# Patient Record
Sex: Male | Born: 1960 | Race: White | Hispanic: No | Marital: Single | Smoking: Current every day smoker
Health system: Southern US, Community
[De-identification: ages and names within clinical notes are randomized; demographics above are authoritative.]

## PROBLEM LIST (undated history)

## (undated) DIAGNOSIS — M1990 Unspecified osteoarthritis, unspecified site: Secondary | ICD-10-CM

## (undated) DIAGNOSIS — K859 Acute pancreatitis without necrosis or infection, unspecified: Secondary | ICD-10-CM

## (undated) DIAGNOSIS — K000 Anodontia: Secondary | ICD-10-CM

## (undated) DIAGNOSIS — K08109 Complete loss of teeth, unspecified cause, unspecified class: Secondary | ICD-10-CM

## (undated) HISTORY — PX: APPENDECTOMY: SHX54

---

## 2016-07-21 ENCOUNTER — Encounter: Payer: Self-pay | Admitting: Emergency Medicine

## 2016-07-21 ENCOUNTER — Emergency Department: Payer: Self-pay

## 2016-07-21 ENCOUNTER — Inpatient Hospital Stay
Admission: EM | Admit: 2016-07-21 | Discharge: 2016-07-24 | DRG: 439 | Disposition: A | Discharge status: Home / Self Care | Payer: Self-pay | Attending: Internal Medicine | Admitting: Internal Medicine

## 2016-07-21 DIAGNOSIS — K5900 Constipation, unspecified: Secondary | ICD-10-CM | POA: Diagnosis present

## 2016-07-21 DIAGNOSIS — K2980 Duodenitis without bleeding: Secondary | ICD-10-CM | POA: Diagnosis present

## 2016-07-21 DIAGNOSIS — K630 Abscess of intestine: Secondary | ICD-10-CM | POA: Diagnosis present

## 2016-07-21 DIAGNOSIS — K859 Acute pancreatitis without necrosis or infection, unspecified: Secondary | ICD-10-CM

## 2016-07-21 DIAGNOSIS — R748 Abnormal levels of other serum enzymes: Secondary | ICD-10-CM | POA: Diagnosis present

## 2016-07-21 DIAGNOSIS — F17210 Nicotine dependence, cigarettes, uncomplicated: Secondary | ICD-10-CM | POA: Diagnosis present

## 2016-07-21 DIAGNOSIS — E876 Hypokalemia: Secondary | ICD-10-CM | POA: Diagnosis present

## 2016-07-21 DIAGNOSIS — D6959 Other secondary thrombocytopenia: Secondary | ICD-10-CM | POA: Diagnosis present

## 2016-07-21 DIAGNOSIS — Z9049 Acquired absence of other specified parts of digestive tract: Secondary | ICD-10-CM

## 2016-07-21 DIAGNOSIS — F10188 Alcohol abuse with other alcohol-induced disorder: Secondary | ICD-10-CM | POA: Diagnosis present

## 2016-07-21 DIAGNOSIS — K852 Alcohol induced acute pancreatitis without necrosis or infection: Principal | ICD-10-CM | POA: Diagnosis present

## 2016-07-21 DIAGNOSIS — R1084 Generalized abdominal pain: Secondary | ICD-10-CM

## 2016-07-21 HISTORY — DX: Acute pancreatitis without necrosis or infection, unspecified: K85.90

## 2016-07-21 LAB — COMPREHENSIVE METABOLIC PANEL
ALBUMIN: 3.9 g/dL (ref 3.5–5.0)
ALK PHOS: 95 U/L (ref 38–126)
ALT: 38 U/L (ref 17–63)
AST: 49 U/L — AB (ref 15–41)
Anion gap: 13 (ref 5–15)
BUN: 11 mg/dL (ref 6–20)
CO2: 29 mmol/L (ref 22–32)
CREATININE: 0.52 mg/dL — AB (ref 0.61–1.24)
Calcium: 9.1 mg/dL (ref 8.9–10.3)
Chloride: 94 mmol/L — ABNORMAL LOW (ref 101–111)
GFR calc Af Amer: >60 mL/min (ref >60)
GLUCOSE: 142 mg/dL — AB (ref 65–99)
Potassium: 3.4 mmol/L — ABNORMAL LOW (ref 3.5–5.1)
Sodium: 136 mmol/L (ref 135–145)
TOTAL PROTEIN: 7.6 g/dL (ref 6.5–8.1)
Total Bilirubin: 1.8 mg/dL — ABNORMAL HIGH (ref 0.3–1.2)

## 2016-07-21 LAB — CBC WITH DIFFERENTIAL/PLATELET
BASOS ABS: 0 10*3/uL (ref 0–0.1)
Basophils Relative: 0 %
Eosinophils Absolute: 0 10*3/uL (ref 0–0.7)
Eosinophils Relative: 0 %
HEMATOCRIT: 47.4 % (ref 40.0–52.0)
HEMOGLOBIN: 16.7 g/dL (ref 13.0–18.0)
LYMPHS PCT: 9 %
Lymphs Abs: 0.5 10*3/uL — ABNORMAL LOW (ref 1.0–3.6)
MCH: 36.9 pg — ABNORMAL HIGH (ref 26.0–34.0)
MCHC: 35.2 g/dL (ref 32.0–36.0)
MCV: 104.7 fL — AB (ref 80.0–100.0)
MONO ABS: 0.5 10*3/uL (ref 0.2–1.0)
Monocytes Relative: 8 %
NEUTROS ABS: 4.9 10*3/uL (ref 1.4–6.5)
NEUTROS PCT: 83 %
Platelets: 131 10*3/uL — ABNORMAL LOW (ref 150–440)
RBC: 4.52 MIL/uL (ref 4.40–5.90)
RDW: 12.9 % (ref 11.5–14.5)
WBC: 5.9 10*3/uL (ref 3.8–10.6)

## 2016-07-21 LAB — PHOSPHORUS: Phosphorus: 3.3 mg/dL (ref 2.5–4.6)

## 2016-07-21 LAB — MAGNESIUM: MAGNESIUM: 1.9 mg/dL (ref 1.7–2.4)

## 2016-07-21 LAB — LIPASE, BLOOD: LIPASE: 1127 U/L — AB (ref 11–51)

## 2016-07-21 MED ORDER — ACETAMINOPHEN 650 MG RE SUPP: 650.0000 mg | Freq: Four times a day (QID) | RECTAL | Status: DC | PRN

## 2016-07-21 MED ORDER — HYDROMORPHONE HCL 1 MG/ML IJ SOLN
1.0000 mg | Freq: Once | INTRAMUSCULAR | Status: AC
  Administered 2016-07-21: 1 mg via INTRAVENOUS
  Filled 2016-07-21: qty 1

## 2016-07-21 MED ORDER — SENNOSIDES-DOCUSATE SODIUM 8.6-50 MG PO TABS: 1.0000 | ORAL_TABLET | Freq: Every evening | ORAL | Status: DC | PRN

## 2016-07-21 MED ORDER — ENOXAPARIN SODIUM 40 MG/0.4ML ~~LOC~~ SOLN
40.0000 mg | SUBCUTANEOUS | Status: DC
  Administered 2016-07-21 – 2016-07-23 (×3): 40 mg via SUBCUTANEOUS
  Filled 2016-07-21 (×3): qty 0.4

## 2016-07-21 MED ORDER — SODIUM CHLORIDE 0.9 % IV SOLN
INTRAVENOUS | Status: DC
  Administered 2016-07-22 – 2016-07-23 (×3): via INTRAVENOUS

## 2016-07-21 MED ORDER — SODIUM CHLORIDE 0.9 % IV SOLN
1000.0000 mL | Freq: Once | INTRAVENOUS | Status: AC
  Administered 2016-07-21: 1000 mL via INTRAVENOUS

## 2016-07-21 MED ORDER — ACETAMINOPHEN 325 MG PO TABS: 650.0000 mg | ORAL_TABLET | Freq: Four times a day (QID) | ORAL | Status: DC | PRN

## 2016-07-21 MED ORDER — ONDANSETRON HCL 4 MG PO TABS: 4.0000 mg | ORAL_TABLET | Freq: Four times a day (QID) | ORAL | Status: DC | PRN

## 2016-07-21 MED ORDER — MAGNESIUM CITRATE PO SOLN
1.0000 | Freq: Once | ORAL | Status: DC | PRN
  Filled 2016-07-21: qty 296

## 2016-07-21 MED ORDER — SODIUM CHLORIDE 0.9% FLUSH
3.0000 mL | Freq: Two times a day (BID) | INTRAVENOUS | Status: DC
  Administered 2016-07-23: 3 mL via INTRAVENOUS

## 2016-07-21 MED ORDER — FAMOTIDINE IN NACL 20-0.9 MG/50ML-% IV SOLN
20.0000 mg | Freq: Once | INTRAVENOUS | Status: AC
  Administered 2016-07-21: 20 mg via INTRAVENOUS
  Filled 2016-07-21: qty 50

## 2016-07-21 MED ORDER — BISACODYL 5 MG PO TBEC: 5.0000 mg | DELAYED_RELEASE_TABLET | Freq: Every day | ORAL | Status: DC | PRN

## 2016-07-21 MED ORDER — THIAMINE HCL 100 MG/ML IJ SOLN
Freq: Once | INTRAVENOUS | Status: AC
  Administered 2016-07-21: 22:00:00 via INTRAVENOUS
  Filled 2016-07-21: qty 1000

## 2016-07-21 MED ORDER — POTASSIUM CHLORIDE 10 MEQ/100ML IV SOLN
10.0000 meq | INTRAVENOUS | Status: AC
  Administered 2016-07-21 – 2016-07-22 (×4): 10 meq via INTRAVENOUS
  Filled 2016-07-21 (×4): qty 100

## 2016-07-21 MED ORDER — LORAZEPAM 2 MG/ML IJ SOLN: 1.0000 mg | Freq: Four times a day (QID) | INTRAMUSCULAR | Status: DC | PRN

## 2016-07-21 MED ORDER — HYDROMORPHONE HCL 1 MG/ML IJ SOLN: 1.0000 mg | INTRAMUSCULAR | Status: DC | PRN

## 2016-07-21 MED ORDER — LORAZEPAM 2 MG/ML IJ SOLN
1.0000 mg | Freq: Once | INTRAMUSCULAR | Status: AC
  Administered 2016-07-21: 1 mg via INTRAVENOUS
  Filled 2016-07-21: qty 1

## 2016-07-21 MED ORDER — LORAZEPAM 1 MG PO TABS: 1.0000 mg | ORAL_TABLET | Freq: Four times a day (QID) | ORAL | Status: DC | PRN

## 2016-07-21 MED ORDER — ONDANSETRON HCL 4 MG/2ML IJ SOLN: 4.0000 mg | Freq: Four times a day (QID) | INTRAMUSCULAR | Status: DC | PRN

## 2016-07-21 MED ORDER — MORPHINE SULFATE (PF) 2 MG/ML IV SOLN: 1.0000 mg | INTRAVENOUS | Status: DC | PRN

## 2016-07-21 MED ORDER — IOPAMIDOL (ISOVUE-300) INJECTION 61%
100.0000 mL | Freq: Once | INTRAVENOUS | Status: AC | PRN
  Administered 2016-07-21: 100 mL via INTRAVENOUS

## 2016-07-21 MED ORDER — ONDANSETRON HCL 4 MG/2ML IJ SOLN
4.0000 mg | Freq: Once | INTRAMUSCULAR | Status: AC
  Administered 2016-07-21: 4 mg via INTRAVENOUS
  Filled 2016-07-21: qty 2

## 2016-07-21 MED ORDER — FAMOTIDINE IN NACL 20-0.9 MG/50ML-% IV SOLN
20.0000 mg | Freq: Two times a day (BID) | INTRAVENOUS | Status: DC
  Administered 2016-07-21 – 2016-07-23 (×4): 20 mg via INTRAVENOUS
  Filled 2016-07-21 (×7): qty 50

## 2016-07-21 NOTE — ED Notes (Signed)
Pt back from ct

## 2016-07-21 NOTE — ED Provider Notes (Signed)
[facility] Emergency Department Provider Note        Time seen: ----------------------------------------- 3:46 PM on 07/21/2016 -----------------------------------------    I have reviewed the triage vital signs and the nursing notes.   HISTORY  Chief Complaint Abdominal Pain    HPI Jordan Horton is a 55 y.o. male who presents to ER for diffuse abdominal pain that started around midnight last night. Patient states he's not had a bowel movement today which is unusual for him. He complains of abdominal tenderness diffusely in his abdomen, has multiple episodes of vomiting today. He denies any fevers or chills, states he smokes daily and drinks 6-12 beers daily. Patient has not had alcohol today due to his abdominal pain. Previous history of appendectomy. Nothing makes symptoms better.   History reviewed. No pertinent past medical history.  There are no active problems to display for this patient.   Past Surgical History:  Procedure Laterality Date  . APPENDECTOMY      Allergies Review of patient's allergies indicates no known allergies.  Social History Social History  Substance Use Topics  . Smoking status: Current Every Day Smoker    Packs/day: 1.00    Types: Cigarettes  . Smokeless tobacco: Never Used  . Alcohol use Yes     Comment: 6-12 beers daily last drank 07/20/16    Review of Systems Constitutional: Negative for fever. Cardiovascular: Negative for chest pain. Respiratory: Negative for shortness of breath. Gastrointestinal: Positive for abdominal pain, vomiting Genitourinary: Negative for dysuria. Musculoskeletal: Negative for back pain. Skin: Negative for rash. Neurological: Negative for headaches, focal weakness or numbness.  10-point ROS otherwise negative.  ____________________________________________   PHYSICAL EXAM:  VITAL SIGNS: ED Triage Vitals  Enc Vitals Group     BP 07/21/16 1530 (!) 163/103     Pulse  Rate 07/21/16 1530 94     Resp 07/21/16 1530 20     Temp 07/21/16 1530 98 F (36.7 C)     Temp Source 07/21/16 1530 Oral     SpO2 07/21/16 1530 96 %     Weight 07/21/16 1531 140 lb (63.5 kg)     Height 07/21/16 1531 5\' 5"  (1.651 m)     Head Circumference --      Peak Flow --      Pain Score 07/21/16 1531 8     Pain Loc --      Pain Edu? --      Excl. in [place]? --    Constitutional: Alert and oriented. Mild distress Eyes: Conjunctivae are normal. PERRL. Normal extraocular movements. ENT   Head: Normocephalic and atraumatic.   Nose: No congestion/rhinnorhea.   Mouth/Throat: Mucous membranes are moist.   Neck: No stridor. Cardiovascular: Normal rate, regular rhythm. No murmurs, rubs, or gallops. Respiratory: Normal respiratory effort without tachypnea nor retractions. Breath sounds are clear and equal bilaterally. No wheezes/rales/rhonchi. Gastrointestinal: Distended, diffuse tenderness, rebound and guarding are present. Musculoskeletal: Nontender with normal range of motion in all extremities. No lower extremity tenderness nor edema. Neurologic:  Normal speech and language. No gross focal neurologic deficits are appreciated. Mild tremors noted Skin:  Skin is warm, dry and intact. No rash noted. Psychiatric: Mood and affect are normal. Speech and behavior are normal.  ____________________________________________  EKG: Interpreted by me. Sinus rhythm with rate of 69 bpm, normal PR interval, normal QRS, long QT, normal axis. Nonspecific ST-T wave changes.  ____________________________________________  ED COURSE:  Pertinent labs & imaging results that were available during my care of  the patient were reviewed by me and considered in my medical decision making (see chart for details). Clinical Course  Patient presents with a diffuse abdominal pain. He presents with signs of peritonitis of unclear etiology. We will assess with CT imaging, give IV pain medicine as well as IV  Ativan for alcohol withdrawal.  Procedures ____________________________________________   LABS (pertinent positives/negatives)  Labs Reviewed  CBC WITH DIFFERENTIAL/PLATELET - Abnormal; Notable for the following:       Result Value   MCV 104.7 (*)    MCH 36.9 (*)    Platelets 131 (*)    Lymphs Abs 0.5 (*)    All other components within normal limits  COMPREHENSIVE METABOLIC PANEL - Abnormal; Notable for the following:    Potassium 3.4 (*)    Chloride 94 (*)    Glucose, Bld 142 (*)    Creatinine, Ser 0.52 (*)    AST 49 (*)    Total Bilirubin 1.8 (*)    All other components within normal limits  LIPASE, BLOOD - Abnormal; Notable for the following:    Lipase 1,127 (*)    All other components within normal limits  URINALYSIS COMPLETEWITH MICROSCOPIC ([facility] ONLY)  URINALYSIS COMPLETEWITH MICROSCOPIC ([facility] ONLY)    RADIOLOGY Images were viewed by me  CT of the abdomen and pelvis with contrast IMPRESSION: 1. The second and third portions of the duodenum are markedly abnormal with wall thickening and mucosal enhancement. There is suspicion for intramural abscesses in the wall of the duodenum. I also suspect ulcerations but no evidence of perforation. There is inflammation of the adjacent pancreas as well. The duodenum is more abnormal in appearance than the pancreas and I suspect the underlying etiology is a severe duodenitis with secondary affects on the pancreas. Pancreatitis affecting the duodenum is considered less likely. A small fluid collection in the uncinate process may connect with the duodenal wall and could represent a small abscess or pseudocyst. 2. Atherosclerosis in the aorta. 3. Probable developing small-bowel ileus. Findings called to Dr. Jimmye Norman  ____________________________________________  FINAL ASSESSMENT AND PLAN  Abdominal pain, alcohol withdrawal, Pancreatitis, possible duodenal mass versus inflammation  Plan: Patient with labs and imaging  as dictated above. Unclear etiology of some of the source of his symptoms. CT finding is not clear at this time. He may require MR imaging of the abdomen. We do not have GI coverage tonight. We will have coverage tomorrow. I have discussed with surgery, it is felt he does not need NG tube treatment at this time. We will place him on withdrawal precautions for alcohol and pain medicine as needed. I will discuss with the hospitalist for admission.   Earleen [place], MD   Note: This dictation was prepared with Dragon dictation. Any transcriptional errors that result from this process are unintentional    Earleen [place], MD 07/21/16 1756

## 2016-07-21 NOTE — H&P (Addendum)
[place] @ [facility] Admission History and Physical Harvie Bridge, D.O.  ---------------------------------------------------------------------------------------------------------------------   PATIENT NAME: Jordan Horton MR#: [id number] DATE OF BIRTH: 08-29-1961 DATE OF ADMISSION: 07/21/2016 PRIMARY CARE PHYSICIAN: No PCP Per Patient  REQUESTING/REFERRING PHYSICIAN: ED Dr. Jimmye Norman  CHIEF COMPLAINT: Chief Complaint  Patient presents with  . Abdominal Pain    HISTORY OF PRESENT ILLNESS: Jordan Horton is a 55 y.o. male with a known history of EtoH abuse  presents to the emergency department complaining of The or abdominal pain which is localized to the mid epigastrium associated with multiple episodes of nonbloody nonbilious vomiting and constipation for the past one day. Patient states that he has not had any symptoms like this in the past. He has not taken any medication or sought any medical attention for these symptoms.  Otherwise there has been no change in status. Patient has been taking medication as prescribed and there has been no recent change in medication or diet.  There has been no recent illness, travel or sick contacts.    Patient denies fevers/chills, weakness, dizziness, chest pain, shortness of breath, , dysuria/frequency, changes in mental status.   EMS/ED COURSE:   Patient receivedPepcid, Dilaudid, Ativan and Zofran  PAST MEDICAL HISTORY: Alcohol use disorder. Otherwise patient denies     PAST SURGICAL HISTORY: Past Surgical History:  Procedure Laterality Date  . APPENDECTOMY        SOCIAL HISTORY: Social History  Substance Use Topics  . Smoking status: Current Every Day Smoker    Packs/day: 1.00    Types: Cigarettes  . Smokeless tobacco: Never Used  . Alcohol use Yes     Comment: 6-12 beers daily last drank 07/20/16      FAMILY HISTORY: Patient denies    MEDICATIONS AT HOME: Prior to Admission medications   Not on  File      DRUG ALLERGIES: No Known Allergies   REVIEW OF SYSTEMS: CONSTITUTIONAL: No fatigue, weakness, fever, chills, weight gain/loss, headache EYES: No blurry or double vision. ENT: No tinnitus, postnasal drip, redness or soreness of the oropharynx. RESPIRATORY: No dyspnea, cough, wheeze, hemoptysis. CARDIOVASCULAR: No chest pain, orthopnea, palpitations, syncope. GASTROINTESTINAL:  positive  nausea and vomiting, constipation,abdominal pain. No hematemesis, melena or hematochezia. GENITOURINARY: No dysuria, frequency, hematuria. ENDOCRINE: No polyuria or nocturia. No heat or cold intolerance. HEMATOLOGY: No anemia, bruising, bleeding. INTEGUMENTARY: No rashes, ulcers, lesions. MUSCULOSKELETAL: No pain, arthritis, swelling, gout. NEUROLOGIC: No numbness, tingling, weakness or ataxia. No seizure-type activity. PSYCHIATRIC: No anxiety, depression, insomnia.  PHYSICAL EXAMINATION: VITAL SIGNS: Blood pressure (!) 167/108, pulse 94, temperature 98 F (36.7 C), temperature source Oral, resp. rate 20, height 5\' 5"  (1.651 m), weight 63.5 kg (140 lb), SpO2 93 %.  GENERAL: 55 y.o.-year-oldwhite male  patient,disheveled and somewhat lethargic secondary to medication. lying in the bed in no acute distress.  Pleasant and cooperative.   HEENT: Head atraumatic, normocephalic. Pupils equal, round, reactive to light and accommodation. No scleral icterus. Extraocular muscles intact. Oropharynx is clear. Mucus membranes moist. NECK: Supple, full range of motion. No JVD, no bruit heard. No cervical lymphadenopathy. CHEST: Normal breath sounds bilaterally. No wheezing, rales, rhonchi or crackles. No use of accessory muscles of respiration.  No reproducible chest wall tenderness.  CARDIOVASCULAR: S1, S2 normal. No murmurs, rubs, or gallops appreciated. Cap refill <2 seconds. ABDOMEN: Soft, moderate midepigastric tenderness to palpation.  nondistended. No rebound, guarding, rigidity. Normoactive bowel  sounds present in all four quadrants. No organomegaly or mass. EXTREMITIES: Full range of  motion. No pedal edema, cyanosis, or clubbing. NEUROLOGIC: Cranial nerves II through XII are grossly intact with no focal sensorimotor deficit. Muscle strength 5/5 in all extremities. Sensation intact. Gait not checked. PSYCHIATRIC: The patient is alert and oriented x 3. Normal affect, mood, thought content. SKIN: Warm, dry, and intact without obvious rash, lesion, or ulcer.  LABORATORY PANEL:  CBC  Recent Labs Lab 07/21/16 1531  WBC 5.9  HGB 16.7  HCT 47.4  PLT 131*   ----------------------------------------------------------------------------------------------------------------- Chemistries  Recent Labs Lab 07/21/16 1531  NA 136  K 3.4*  CL 94*  CO2 29  GLUCOSE 142*  BUN 11  CREATININE 0.52*  CALCIUM 9.1  AST 49*  ALT 38  ALKPHOS 95  BILITOT 1.8*   ------------------------------------------------------------------------------------------------------------------ Cardiac Enzymes No results for input(s): TROPONINI in the last 168 hours. ------------------------------------------------------------------------------------------------------------------  RADIOLOGY: Ct Abdomen Pelvis W Contrast  Result Date: 07/21/2016 CLINICAL DATA:  Abdominal pain. EXAM: CT ABDOMEN AND PELVIS WITH CONTRAST TECHNIQUE: Multidetector CT imaging of the abdomen and pelvis was performed using the standard protocol following bolus administration of intravenous contrast. CONTRAST:  140mL ISOVUE-300 IOPAMIDOL (ISOVUE-300) INJECTION 61% COMPARISON:  None. FINDINGS: Lower chest: No acute abnormality. Hepatobiliary: Hepatic steatosis is identified. No focal liver masses are seen. The gallbladder is normal in appearance with no obvious cholelithiasis. The portal vein is normal. Pancreas: The pancreatic duct is mildly dilated, measuring up to 3.7 mm, extending into the head of the pancreas. The head of the pancreas  also appears to be somewhat edematous. A small region of low attenuation is seen in the uncinate process on series 10, image 28 measuring 9.6 mm, fluid attenuation in appearance. Spleen: Normal in size without focal abnormality. Adrenals/Urinary Tract: Adrenal glands are unremarkable. Kidneys are normal, without renal calculi, focal lesion, or hydronephrosis. Bladder is unremarkable. Stomach/Bowel: The stomach is normal in appearance. The duodenum is grossly abnormal in the second and third segments. There is abnormal increased mucosal enhancement and marked wall thickening. There is also focal expansion of the duodenum just posterior and inferior to the pancreatic head. There is fluid attenuation in the wall of the duodenum. The small rounded region of fluid attenuation in the uncinate process of the pancreas may connect to the duodenal wall based on coronal image 39. The fourth portion of the duodenum is normal in appearance. Several loops of proximal small bowel are mildly prominent in caliber, likely due to ileus. No convincing evidence of obstruction. Fecal material in the terminal ileum is consistent with slow transit time. The appendix is not visualized but there is no secondary evidence of appendicitis. The colon is normal. Vascular/Lymphatic: Atherosclerotic changes seen in the abdominal aorta and iliac vessels. No aneurysm or dissection. The SMA is encased by the inflammation around the pancreas and duodenum with slight narrowing but no thrombosis. No adenopathy. Reproductive: Prostate is unremarkable. Other: No free air identified. Free fluid is reactive to the process in the right upper abdomen. Musculoskeletal: Vacuum disc phenomena at L4-5 with a disc bulge at this level and probable neural foraminal narrowing bilaterally. No acute fracture or bony erosion. IMPRESSION: 1. The second and third portions of the duodenum are markedly abnormal with wall thickening and mucosal enhancement. There is suspicion  for intramural abscesses in the wall of the duodenum. I also suspect ulcerations but no evidence of perforation. There is inflammation of the adjacent pancreas as well. The duodenum is more abnormal in appearance than the pancreas and I suspect the underlying etiology is a severe duodenitis with  secondary affects on the pancreas. Pancreatitis affecting the duodenum is considered less likely. A small fluid collection in the uncinate process may connect with the duodenal wall and could represent a small abscess or pseudocyst. 2. Atherosclerosis in the aorta. 3. Probable developing small-bowel ileus. Findings called to Dr. Jimmye Norman Electronically Signed   By: Dorise Bullion III M.D   On: 07/21/2016 17:25    EKG: Normal sinus rhythm at 69 bpm with normal axis and nonspecific ST-T wave changes.   IMPRESSION AND PLAN:  This is a 55 y.o. male with a history of  ALCOHOL USE DISORDERow being admitted with: 1. Acute alcoholic pancreatitis-admit to inpatient for IV fluid hydration, pain control, antiemetics, nothing by mouth.  2.  possible duodenitis-questionable CT findings mass vs. Duodenitis were discussed by ED physician with general surgery. We'll request GI consultation for further evaluation and consideration of endoscopy. General surgery consultation requested as well. Will start IV Protonix.  3. Alcohol use disorder with possible withdrawal-we'll monitor for signs of withdrawal.CIWA protocol in place. Social work consultation also requested. 4. Hypokalemia - will address with IV K riders   Diet/Nutrition:nothing by mouth Fluids:  IV normal saline DVT Px: Lovenox, SCDs and early ambulation Code Status: Full  All the records are reviewed and case discussed with ED provider. Management plans discussed with the patient and/or family who express understanding and agree with plan of care.   TOTAL TIME TAKING CARE OF THIS PATIENT: 60 minutes.   Vedant Shehadeh D.O. on 07/21/2016 at 8:03  PM Between 7am to 6pm - Pager - [phone] After 6pm go to [email or web address] - [facility] Office [phone] CC: Primary care physician; No PCP Per Patient     Note: This dictation was prepared with Dragon dictation along with smaller phrase technology. Any transcriptional errors that result from this process are unintentional.

## 2016-07-21 NOTE — ED Notes (Signed)
Pt sp02 in the low 80's. Pt states he does not feel sob. Smokes 1ppd x 40 years. Denies any history or meds. Hands shaking slightly. Pt admits to daily drinking. Denies previous abd pain from drinking

## 2016-07-21 NOTE — ED Triage Notes (Signed)
Pt reports around midnight last night, diffused abdominal pain started. Pt states he has not had a BM today which is not normal for him.  Pt c/o abdominal tenderness in all 4 quads.  Vomited multiple times today.

## 2016-07-22 DIAGNOSIS — R1084 Generalized abdominal pain: Secondary | ICD-10-CM

## 2016-07-22 DIAGNOSIS — K852 Alcohol induced acute pancreatitis without necrosis or infection: Principal | ICD-10-CM

## 2016-07-22 DIAGNOSIS — K859 Acute pancreatitis without necrosis or infection, unspecified: Secondary | ICD-10-CM

## 2016-07-22 DIAGNOSIS — K2980 Duodenitis without bleeding: Secondary | ICD-10-CM

## 2016-07-22 LAB — CBC
HCT: 41.9 % (ref 40.0–52.0)
HEMOGLOBIN: 14.7 g/dL (ref 13.0–18.0)
MCH: 36.8 pg — ABNORMAL HIGH (ref 26.0–34.0)
MCHC: 35.2 g/dL (ref 32.0–36.0)
MCV: 104.5 fL — ABNORMAL HIGH (ref 80.0–100.0)
PLATELETS: 101 10*3/uL — AB (ref 150–440)
RBC: 4.01 MIL/uL — AB (ref 4.40–5.90)
RDW: 12.8 % (ref 11.5–14.5)
WBC: 1.4 10*3/uL — AB (ref 3.8–10.6)

## 2016-07-22 LAB — URINALYSIS COMPLETE WITH MICROSCOPIC (ARMC ONLY)
BACTERIA UA: NONE SEEN
Bilirubin Urine: NEGATIVE
Glucose, UA: NEGATIVE mg/dL
LEUKOCYTES UA: NEGATIVE
NITRITE: NEGATIVE
PH: 5 (ref 5.0–8.0)
PROTEIN: 30 mg/dL — AB
SPECIFIC GRAVITY, URINE: 1.012 (ref 1.005–1.030)

## 2016-07-22 LAB — BASIC METABOLIC PANEL
ANION GAP: 10 (ref 5–15)
BUN: 9 mg/dL (ref 6–20)
CALCIUM: 8.1 mg/dL — AB (ref 8.9–10.3)
CO2: 26 mmol/L (ref 22–32)
Chloride: 99 mmol/L — ABNORMAL LOW (ref 101–111)
Creatinine, Ser: 0.48 mg/dL — ABNORMAL LOW (ref 0.61–1.24)
Glucose, Bld: 109 mg/dL — ABNORMAL HIGH (ref 65–99)
Potassium: 2.9 mmol/L — ABNORMAL LOW (ref 3.5–5.1)
SODIUM: 135 mmol/L (ref 135–145)

## 2016-07-22 LAB — LIPASE, BLOOD: Lipase: 1051 U/L — ABNORMAL HIGH (ref 11–51)

## 2016-07-22 MED ORDER — POTASSIUM CHLORIDE CRYS ER 20 MEQ PO TBCR
40.0000 meq | EXTENDED_RELEASE_TABLET | ORAL | Status: AC
  Administered 2016-07-22 (×2): 40 meq via ORAL
  Filled 2016-07-22 (×2): qty 2

## 2016-07-22 NOTE — Progress Notes (Signed)
Chaplain rounding visited patient to find out how he was doing. Pt told chaplain he has a procedure and wanted to have prayers for that. Chaplain provided prayer and support for the patient and also promised to visit him again soon.   07/22/16 1500  Clinical Encounter Type  Visited With Patient  Visit Type Initial  Referral From Nurse  Spiritual Encounters  Spiritual Needs Prayer

## 2016-07-22 NOTE — Progress Notes (Addendum)
Paged MD about critical lab of 1.4 WBC. And potassium level of 2.9.

## 2016-07-22 NOTE — Progress Notes (Signed)
Pt admitted to unit last night. No episodes of vomiting or diarrhea, administered 4 bags of potassium as ordered starting at 2200. Pt says he has no pain. Will continue to monitor.

## 2016-07-22 NOTE — Consult Note (Signed)
Jordan Lame, MD [place] [address] [place], [place] [address] Phone: [phone] Fax : [phone]  Consultation  Referring Provider:     No ref. provider found Primary Care Physician:  No PCP Per Patient Primary Gastroenterologist:  None         Reason for Consultation:     Pancreatitis  Date of Admission:  07/21/2016 Date of Consultation:  07/22/2016         HPI:   Jordan Horton is a 55 y.o. male who comes in to the hospital with a report of abdominal pain. The patient reports abdominal pain is diffuse. The patient also had non-bloody vomiting. The patient has a long history of drinking 6-12 beers per day. The patient also reports that he is never had pancreatitis in the past. The patient was found on a CT scan to have pancreatitis with some possible abscess in the duodenum with duodenal wall thickening. The patient states that his pain is completely gone at this time and is tolerating clear liquids. There is no report of any fevers or chills. The patient reports that he has decided to stop any further drinking in the future. The patient's lipase on admission was 1127. The repeat today showed it down to only 1051. The patient's white cell count is low at 4.0 with a normal hemoglobin and hematocrit. The patient does have thrombocytopenia.  History reviewed. No pertinent past medical history.  Past Surgical History:  Procedure Laterality Date  . APPENDECTOMY      Prior to Admission medications   Not on File    History reviewed. No pertinent family history.   Social History  Substance Use Topics  . Smoking status: Current Every Day Smoker    Packs/day: 1.00    Years: 40.00    Types: Cigarettes  . Smokeless tobacco: Never Used  . Alcohol use Yes     Comment: 6-12 beers daily last drank 07/20/16    Allergies as of 07/21/2016  . (No Known Allergies)    Review of Systems:    All systems reviewed and negative except where noted in HPI.   Physical Exam:    Vital signs in last 24 hours: Temp:  [97.8 F (36.6 C)-99 F (37.2 C)] 99 F (37.2 C) (10/09 1354) Pulse Rate:  [80-116] 98 (10/09 1354) Resp:  [18-20] 18 (10/09 1354) BP: (130-168)/(90-114) 145/91 (10/09 1354) SpO2:  [93 %-100 %] 96 % (10/09 1354) Weight:  [140 lb (63.5 kg)] 140 lb (63.5 kg) (10/08 1531) Last BM Date: 07/20/16 General:   Pleasant, cooperative in NAD Head:  Normocephalic and atraumatic. Eyes:   No icterus.   Conjunctiva pink. PERRLA. Ears:  Normal auditory acuity. Neck:  Supple; no masses or thyroidomegaly Lungs: Respirations even and unlabored. Lungs clear to auscultation bilaterally.   No wheezes, crackles, or rhonchi.  Heart:  Regular rate and rhythm;  Without murmur, clicks, rubs or gallops Abdomen:  Soft, nondistended, nontender. Normal bowel sounds. No appreciable masses or hepatomegaly.  No rebound or guarding.  Rectal:  Not performed. Msk:  Symmetrical without gross deformities.   Extremities:  Without edema, cyanosis or clubbing. Neurologic:  Alert and oriented x3;  grossly normal neurologically. Skin:  Intact without significant lesions or rashes. Cervical Nodes:  No significant cervical adenopathy. Psych:  Alert and cooperative. Normal affect.  LAB RESULTS:  Recent Labs  07/21/16 1531 07/22/16 0339  WBC 5.9 1.4*  HGB 16.7 14.7  HCT 47.4 41.9  PLT 131* 101*   BMET  Recent Labs  07/21/16 1531 07/22/16 0339  NA 136 135  K 3.4* 2.9*  CL 94* 99*  CO2 29 26  GLUCOSE 142* 109*  BUN 11 9  CREATININE 0.52* 0.48*  CALCIUM 9.1 8.1*   LFT  Recent Labs  07/21/16 1531  PROT 7.6  ALBUMIN 3.9  AST 49*  ALT 38  ALKPHOS 95  BILITOT 1.8*   PT/INR No results for input(s): LABPROT, INR in the last 72 hours.  STUDIES: Ct Abdomen Pelvis W Contrast  Result Date: 07/21/2016 CLINICAL DATA:  Abdominal pain. EXAM: CT ABDOMEN AND PELVIS WITH CONTRAST TECHNIQUE: Multidetector CT imaging of the abdomen and pelvis was performed using the standard  protocol following bolus administration of intravenous contrast. CONTRAST:  169mL ISOVUE-300 IOPAMIDOL (ISOVUE-300) INJECTION 61% COMPARISON:  None. FINDINGS: Lower chest: No acute abnormality. Hepatobiliary: Hepatic steatosis is identified. No focal liver masses are seen. The gallbladder is normal in appearance with no obvious cholelithiasis. The portal vein is normal. Pancreas: The pancreatic duct is mildly dilated, measuring up to 3.7 mm, extending into the head of the pancreas. The head of the pancreas also appears to be somewhat edematous. A small region of low attenuation is seen in the uncinate process on series 10, image 28 measuring 9.6 mm, fluid attenuation in appearance. Spleen: Normal in size without focal abnormality. Adrenals/Urinary Tract: Adrenal glands are unremarkable. Kidneys are normal, without renal calculi, focal lesion, or hydronephrosis. Bladder is unremarkable. Stomach/Bowel: The stomach is normal in appearance. The duodenum is grossly abnormal in the second and third segments. There is abnormal increased mucosal enhancement and marked wall thickening. There is also focal expansion of the duodenum just posterior and inferior to the pancreatic head. There is fluid attenuation in the wall of the duodenum. The small rounded region of fluid attenuation in the uncinate process of the pancreas may connect to the duodenal wall based on coronal image 39. The fourth portion of the duodenum is normal in appearance. Several loops of proximal small bowel are mildly prominent in caliber, likely due to ileus. No convincing evidence of obstruction. Fecal material in the terminal ileum is consistent with slow transit time. The appendix is not visualized but there is no secondary evidence of appendicitis. The colon is normal. Vascular/Lymphatic: Atherosclerotic changes seen in the abdominal aorta and iliac vessels. No aneurysm or dissection. The SMA is encased by the inflammation around the pancreas and  duodenum with slight narrowing but no thrombosis. No adenopathy. Reproductive: Prostate is unremarkable. Other: No free air identified. Free fluid is reactive to the process in the right upper abdomen. Musculoskeletal: Vacuum disc phenomena at L4-5 with a disc bulge at this level and probable neural foraminal narrowing bilaterally. No acute fracture or bony erosion. IMPRESSION: 1. The second and third portions of the duodenum are markedly abnormal with wall thickening and mucosal enhancement. There is suspicion for intramural abscesses in the wall of the duodenum. I also suspect ulcerations but no evidence of perforation. There is inflammation of the adjacent pancreas as well. The duodenum is more abnormal in appearance than the pancreas and I suspect the underlying etiology is a severe duodenitis with secondary affects on the pancreas. Pancreatitis affecting the duodenum is considered less likely. A small fluid collection in the uncinate process may connect with the duodenal wall and could represent a small abscess or pseudocyst. 2. Atherosclerosis in the aorta. 3. Probable developing small-bowel ileus. Findings called to Dr. Jimmye Norman Electronically Signed   By: Dorise Bullion III M.D  On: 07/21/2016 17:25      Impression / Plan:   Jordan Horton is a 55 y.o. y/o male with Omission with pancreatitis. The patient was found to have thickening of the duodenum with questionable abscess in the wall of the duodenum. It the patient's pancreatitis is likely due to alcohol but may be due to his duodenitis/duodenal ulcer. The patient denies taking Advil Aleve Motrin or any anti-inflammatory medications. The patient will be set up for an upper endoscopy for tomorrow to evaluate his duodenum for ulcerations. I have discussed risks & benefits which include, but are not limited to, bleeding, infection, perforation & drug reaction.  The patient agrees with this plan & written consent will be obtained.      Thank  you for involving me in the care of this patient.      LOS: 1 day   Jordan Lame, MD  07/22/2016, 2:05 PM   Note: This dictation was prepared with Dragon dictation along with smaller phrase technology. Any transcriptional errors that result from this process are unintentional.

## 2016-07-22 NOTE — Clinical Social Work Note (Signed)
Clinical Social Work Assessment  Patient Details  Name: Jordan Horton MRN: [id number] Date of Birth: [birth year]  Date of referral:  07/22/16               Reason for consult:  Substance Use/ETOH Abuse                Permission sought to share information with:    Permission granted to share information::     Name::        Agency::     Relationship::     Contact Information:     Housing/Transportation Living arrangements for the past 2 months:  Single Family Home Source of Information:  Patient Patient Interpreter Needed:  None Criminal Activity/Legal Involvement Pertinent to Current Situation/Hospitalization:  No - Comment as needed Significant Relationships:  Adult Children, Other Family Members Lives with:  Parents Do you feel safe going back to the place where you live?  Yes Need for family participation in patient care:  Yes (Comment)  Care giving concerns:  Patient lives in [place] with his 9 y.o mother Jordan Horton and an uncle.    Social Worker assessment / plan:  Holiday representative (CSW) received substance abuse consult. CSW met with patient alone at bedside to address consult. Patient was alert and oriented, sitting up in the bed and watching T.V. CSW introduced self and explained role of CSW department. Patient reported that he lives with and helps take care of his elderly mother. Patient reported that he does not drive because he doesn't have a license. Patient reported that his uncle also lives in the house and provides transportation. Patient reported that he has 3 adult children and 3 grandchildren that live in [place], [place]. Patient reported that he was in the [place] from [phone] and that is when he started drinking heavy. Patient reported that he drinks beet daily and has decided to quit. Per patient his last drink has 2-3 days ago. Patient reported that he is familiar with the substance abuse resources available and plans on going to some groups. CSW provided patient with  a list of outpatient community resources including substance abuse and transportation. CSW also encouraged patient to go sign up to receive healthcare and other services through the [facility]. Patient reported that he has not regustered with the [facility] because his father went to the [facility] and had a bad experience. CSW explained that the [facility] also has substance abuse resources. Patient verbalized his understanding. CSW will continue to follow and assist as needed.   Employment status:  Retired Forensic scientist:  Self Pay (Medicaid Pending) PT Recommendations:  Not assessed at this time Information / Referral to community resources:  Outpatient Substance Abuse Treatment Options  Patient/Family's Response to care:  Patient appeared motivated to stop drinking.   Patient/Family's Understanding of and Emotional Response to Diagnosis, Current Treatment, and Prognosis:  Patient accepted resources and thanked CSW for assistance.   Emotional Assessment Appearance:  Appears stated age Attitude/Demeanor/Rapport:    Affect (typically observed):  Accepting, Adaptable, Pleasant Orientation:  Oriented to Self, Oriented to Place, Oriented to  Time, Oriented to Situation Alcohol / Substance use:  Alcohol Use Psych involvement (Current and /or in the community):  No (Comment)  Discharge Needs  Concerns to be addressed:  Discharge Planning Concerns Readmission within the last 30 days:  No Current discharge risk:  Substance Abuse Barriers to Discharge:  Continued Medical Work up   [employer], Jordan Beets, LCSW 07/22/2016, 1:35 PM

## 2016-07-22 NOTE — Progress Notes (Signed)
[facility] [facility] [place] NAME: Jordan Horton    MR#:  [id number]  DATE OF BIRTH:  03/14/1961  SUBJECTIVE:   Pt. here due to abdominal pain, nausea vomiting noted to have acute pancreatitis along with possible duodenitis. Abdominal pain much improved, no nausea or vomiting.  REVIEW OF SYSTEMS:    Review of Systems  Constitutional: Negative for chills and fever.  HENT: Negative for congestion and tinnitus.   Eyes: Negative for blurred vision and double vision.  Respiratory: Negative for cough, shortness of breath and wheezing.   Cardiovascular: Negative for chest pain, orthopnea and PND.  Gastrointestinal: Negative for abdominal pain, diarrhea, nausea and vomiting.  Genitourinary: Negative for dysuria and hematuria.  Neurological: Negative for dizziness, sensory change and focal weakness.  All other systems reviewed and are negative.   Nutrition: Clear liquids Tolerating Diet: yes Tolerating PT:  Ambulatory   DRUG ALLERGIES:  No Known Allergies  VITALS:  Blood pressure (!) 145/91, pulse 98, temperature 99 F (37.2 C), temperature source Oral, resp. rate 18, height 5\' 5"  (1.651 m), weight 63.5 kg (140 lb), SpO2 96 %.  PHYSICAL EXAMINATION:   Physical Exam  GENERAL:  55 y.o.-year-old patient lying in the bed with in acute distress.  EYES: Pupils equal, round, reactive to light and accommodation. No scleral icterus. Extraocular muscles intact.  HEENT: Head atraumatic, normocephalic. Oropharynx and nasopharynx clear.  NECK:  Supple, no jugular venous distention. No thyroid enlargement, no tenderness.  LUNGS: Normal breath sounds bilaterally, no wheezing, rales, rhonchi. No use of accessory muscles of respiration.  CARDIOVASCULAR: S1, S2 normal. No murmurs, rubs, or gallops.  ABDOMEN: Soft, nontender, nondistended. Bowel sounds present. No organomegaly or mass.  EXTREMITIES: No cyanosis, clubbing or edema b/l.    NEUROLOGIC: Cranial  nerves II through XII are intact. No focal Motor or sensory deficits b/l.   PSYCHIATRIC: The patient is alert and oriented x 3.  SKIN: No obvious rash, lesion, or ulcer.    LABORATORY PANEL:   CBC  Recent Labs Lab 07/22/16 0339  WBC 1.4*  HGB 14.7  HCT 41.9  PLT 101*   ------------------------------------------------------------------------------------------------------------------  Chemistries   Recent Labs Lab 07/21/16 1531 07/22/16 0339  NA 136 135  K 3.4* 2.9*  CL 94* 99*  CO2 29 26  GLUCOSE 142* 109*  BUN 11 9  CREATININE 0.52* 0.48*  CALCIUM 9.1 8.1*  MG 1.9  --   AST 49*  --   ALT 38  --   ALKPHOS 95  --   BILITOT 1.8*  --    ------------------------------------------------------------------------------------------------------------------  Cardiac Enzymes No results for input(s): TROPONINI in the last 168 hours. ------------------------------------------------------------------------------------------------------------------  RADIOLOGY:  Ct Abdomen Pelvis W Contrast  Result Date: 07/21/2016 CLINICAL DATA:  Abdominal pain. EXAM: CT ABDOMEN AND PELVIS WITH CONTRAST TECHNIQUE: Multidetector CT imaging of the abdomen and pelvis was performed using the standard protocol following bolus administration of intravenous contrast. CONTRAST:  165mL ISOVUE-300 IOPAMIDOL (ISOVUE-300) INJECTION 61% COMPARISON:  None. FINDINGS: Lower chest: No acute abnormality. Hepatobiliary: Hepatic steatosis is identified. No focal liver masses are seen. The gallbladder is normal in appearance with no obvious cholelithiasis. The portal vein is normal. Pancreas: The pancreatic duct is mildly dilated, measuring up to 3.7 mm, extending into the head of the pancreas. The head of the pancreas also appears to be somewhat edematous. A small region of low attenuation is seen in the uncinate process on series 10, image 28 measuring 9.6 mm, fluid attenuation  in appearance. Spleen: Normal in size  without focal abnormality. Adrenals/Urinary Tract: Adrenal glands are unremarkable. Kidneys are normal, without renal calculi, focal lesion, or hydronephrosis. Bladder is unremarkable. Stomach/Bowel: The stomach is normal in appearance. The duodenum is grossly abnormal in the second and third segments. There is abnormal increased mucosal enhancement and marked wall thickening. There is also focal expansion of the duodenum just posterior and inferior to the pancreatic head. There is fluid attenuation in the wall of the duodenum. The small rounded region of fluid attenuation in the uncinate process of the pancreas may connect to the duodenal wall based on coronal image 39. The fourth portion of the duodenum is normal in appearance. Several loops of proximal small bowel are mildly prominent in caliber, likely due to ileus. No convincing evidence of obstruction. Fecal material in the terminal ileum is consistent with slow transit time. The appendix is not visualized but there is no secondary evidence of appendicitis. The colon is normal. Vascular/Lymphatic: Atherosclerotic changes seen in the abdominal aorta and iliac vessels. No aneurysm or dissection. The SMA is encased by the inflammation around the pancreas and duodenum with slight narrowing but no thrombosis. No adenopathy. Reproductive: Prostate is unremarkable. Other: No free air identified. Free fluid is reactive to the process in the right upper abdomen. Musculoskeletal: Vacuum disc phenomena at L4-5 with a disc bulge at this level and probable neural foraminal narrowing bilaterally. No acute fracture or bony erosion. IMPRESSION: 1. The second and third portions of the duodenum are markedly abnormal with wall thickening and mucosal enhancement. There is suspicion for intramural abscesses in the wall of the duodenum. I also suspect ulcerations but no evidence of perforation. There is inflammation of the adjacent pancreas as well. The duodenum is more abnormal in  appearance than the pancreas and I suspect the underlying etiology is a severe duodenitis with secondary affects on the pancreas. Pancreatitis affecting the duodenum is considered less likely. A small fluid collection in the uncinate process may connect with the duodenal wall and could represent a small abscess or pseudocyst. 2. Atherosclerosis in the aorta. 3. Probable developing small-bowel ileus. Findings called to Dr. Jimmye Norman Electronically Signed   By: Dorise Bullion III M.D   On: 07/21/2016 17:25     ASSESSMENT AND PLAN:   55 year old male with past medical history of alcohol abuse who presented to the hospital due to abdominal pain nausea and vomiting.   1. Acute alcoholic pancreatitis-this is the cause of patient's abdominal pain nausea vomiting. Patient was noted to have an elevated lipase could greater than 1000. -Continue supportive care with IV fluids, antiemetics, pain control. Clinically improved today. Lipase trending down. -We'll start on clear liquid diet.  2. Duodenitis/duodenal abscess-this was noticed on the CT scan of the abdomen and pelvis on admission. Clinically patient is afebrile, hemodynamically stable. -Gastroenterology consult obtained. Plan for upper GI endoscopy tomorrow. Continue supportive care for now.  3. Hypokalemia-secondary to the pancreatitis and nausea vomiting. -Continue to replace and repeat level in the morning. Check magnesium level in a.m.  4. Alcohol abuse-high risk for alcohol withdrawal. Continue CIWA protocol.   All the records are reviewed and case discussed with Care Management/Social Worker. Management plans discussed with the patient, family and they are in agreement.  CODE STATUS: Full  DVT Prophylaxis: Lovenox  TOTAL TIME TAKING CARE OF THIS PATIENT: 30 minutes.   POSSIBLE D/C IN 1-2 DAYS, DEPENDING ON CLINICAL CONDITION.   Henreitta Leber M.D on 07/22/2016 at 2:40 PM  Between  7am to 6pm - Pager - [phone]  After 6pm go  to [email or web address] - [employer] [facility]  Office  [phone]  CC: Primary care physician; No PCP Per Patient

## 2016-07-23 ENCOUNTER — Inpatient Hospital Stay: Payer: Self-pay | Admitting: Anesthesiology

## 2016-07-23 ENCOUNTER — Encounter: Payer: Self-pay | Admitting: *Deleted

## 2016-07-23 ENCOUNTER — Encounter
Admission: EM | Disposition: A | Discharge status: Home / Self Care | Payer: Self-pay | Source: Home / Self Care | Attending: Specialist

## 2016-07-23 LAB — COMPREHENSIVE METABOLIC PANEL
ALBUMIN: 2.4 g/dL — AB (ref 3.5–5.0)
ALK PHOS: 56 U/L (ref 38–126)
ALT: 21 U/L (ref 17–63)
AST: 33 U/L (ref 15–41)
Anion gap: 9 (ref 5–15)
BILIRUBIN TOTAL: 1.6 mg/dL — AB (ref 0.3–1.2)
BUN: 8 mg/dL (ref 6–20)
CALCIUM: 7.6 mg/dL — AB (ref 8.9–10.3)
CO2: 26 mmol/L (ref 22–32)
CREATININE: 0.52 mg/dL — AB (ref 0.61–1.24)
Chloride: 99 mmol/L — ABNORMAL LOW (ref 101–111)
GFR calc Af Amer: >60 mL/min (ref >60)
GFR calc non Af Amer: >60 mL/min (ref >60)
GLUCOSE: 66 mg/dL (ref 65–99)
Potassium: 2.5 mmol/L — CL (ref 3.5–5.1)
SODIUM: 134 mmol/L — AB (ref 135–145)
TOTAL PROTEIN: 5.4 g/dL — AB (ref 6.5–8.1)

## 2016-07-23 LAB — LIPASE, BLOOD: LIPASE: 224 U/L — AB (ref 11–51)

## 2016-07-23 LAB — CBC
HCT: 38.3 % — ABNORMAL LOW (ref 40.0–52.0)
Hemoglobin: 13.4 g/dL (ref 13.0–18.0)
MCH: 36.2 pg — AB (ref 26.0–34.0)
MCHC: 35 g/dL (ref 32.0–36.0)
MCV: 103.4 fL — ABNORMAL HIGH (ref 80.0–100.0)
PLATELETS: 91 10*3/uL — AB (ref 150–440)
RBC: 3.71 MIL/uL — ABNORMAL LOW (ref 4.40–5.90)
RDW: 13.1 % (ref 11.5–14.5)
WBC: 4.4 10*3/uL (ref 3.8–10.6)

## 2016-07-23 LAB — POTASSIUM: Potassium: 2.8 mmol/L — ABNORMAL LOW (ref 3.5–5.1)

## 2016-07-23 SURGERY — ESOPHAGOGASTRODUODENOSCOPY (EGD) WITH PROPOFOL
Anesthesia: General

## 2016-07-23 MED ORDER — POTASSIUM CHLORIDE CRYS ER 20 MEQ PO TBCR
20.0000 meq | EXTENDED_RELEASE_TABLET | Freq: Three times a day (TID) | ORAL | Status: DC
  Administered 2016-07-23 – 2016-07-24 (×2): 20 meq via ORAL
  Filled 2016-07-23 (×2): qty 1

## 2016-07-23 MED ORDER — POTASSIUM CHLORIDE 10 MEQ/100ML IV SOLN
10.0000 meq | INTRAVENOUS | Status: AC
  Administered 2016-07-23 (×4): 10 meq via INTRAVENOUS
  Filled 2016-07-23 (×6): qty 100

## 2016-07-23 MED ORDER — POTASSIUM CHLORIDE CRYS ER 20 MEQ PO TBCR
40.0000 meq | EXTENDED_RELEASE_TABLET | Freq: Once | ORAL | Status: AC
  Administered 2016-07-23: 40 meq via ORAL
  Filled 2016-07-23: qty 2

## 2016-07-23 NOTE — Progress Notes (Signed)
[facility] [place] NAME: Jordan Horton    MR#:  [id number]  DATE OF BIRTH:  Apr 09, 1961  SUBJECTIVE:   Pt. here due to abdominal pain, nausea vomiting noted to have acute pancreatitis along with possible duodenitis. Lipase has improved.  No abdominal pain.  Remains hypokalemic and it's being supplemented.  Going for Endoscopy later today.   REVIEW OF SYSTEMS:    Review of Systems  Constitutional: Negative for chills and fever.  HENT: Negative for congestion and tinnitus.   Eyes: Negative for blurred vision and double vision.  Respiratory: Negative for cough, shortness of breath and wheezing.   Cardiovascular: Negative for chest pain, orthopnea and PND.  Gastrointestinal: Negative for abdominal pain, diarrhea, nausea and vomiting.  Genitourinary: Negative for dysuria and hematuria.  Neurological: Negative for dizziness, sensory change and focal weakness.  All other systems reviewed and are negative.   Nutrition: NPO for endoscopy Tolerating Diet: NO Tolerating PT:  Ambulatory   DRUG ALLERGIES:  No Known Allergies  VITALS:  Blood pressure (!) 134/91, pulse (!) 102, temperature 98.8 F (37.1 C), temperature source Tympanic, resp. rate 16, height 5\' 5"  (1.651 m), weight 63.5 kg (140 lb), SpO2 93 %.  PHYSICAL EXAMINATION:   Physical Exam  GENERAL:  55 y.o.-year-old patient lying in the bed with in acute distress.  EYES: Pupils equal, round, reactive to light and accommodation. No scleral icterus. Extraocular muscles intact.  HEENT: Head atraumatic, normocephalic. Oropharynx and nasopharynx clear.  NECK:  Supple, no jugular venous distention. No thyroid enlargement, no tenderness.  LUNGS: Normal breath sounds bilaterally, no wheezing, rales, rhonchi. No use of accessory muscles of respiration.  CARDIOVASCULAR: S1, S2 normal. No murmurs, rubs, or gallops.  ABDOMEN: Soft, nontender, nondistended. Bowel sounds present. No  organomegaly or mass.  EXTREMITIES: No cyanosis, clubbing or edema b/l.    NEUROLOGIC: Cranial nerves II through XII are intact. No focal Motor or sensory deficits b/l.   PSYCHIATRIC: The patient is alert and oriented x 3.  SKIN: No obvious rash, lesion, or ulcer.    LABORATORY PANEL:   CBC  Recent Labs Lab 07/23/16 0410  WBC 4.4  HGB 13.4  HCT 38.3*  PLT 91*   ------------------------------------------------------------------------------------------------------------------  Chemistries   Recent Labs Lab 07/21/16 1531  07/23/16 0410  NA 136  < > 134*  K 3.4*  < > 2.5*  CL 94*  < > 99*  CO2 29  < > 26  GLUCOSE 142*  < > 66  BUN 11  < > 8  CREATININE 0.52*  < > 0.52*  CALCIUM 9.1  < > 7.6*  MG 1.9  --   --   AST 49*  --  33  ALT 38  --  21  ALKPHOS 95  --  56  BILITOT 1.8*  --  1.6*  < > = values in this interval not displayed. ------------------------------------------------------------------------------------------------------------------  Cardiac Enzymes No results for input(s): TROPONINI in the last 168 hours. ------------------------------------------------------------------------------------------------------------------  RADIOLOGY:  Ct Abdomen Pelvis W Contrast  Result Date: 07/21/2016 CLINICAL DATA:  Abdominal pain. EXAM: CT ABDOMEN AND PELVIS WITH CONTRAST TECHNIQUE: Multidetector CT imaging of the abdomen and pelvis was performed using the standard protocol following bolus administration of intravenous contrast. CONTRAST:  166mL ISOVUE-300 IOPAMIDOL (ISOVUE-300) INJECTION 61% COMPARISON:  None. FINDINGS: Lower chest: No acute abnormality. Hepatobiliary: Hepatic steatosis is identified. No focal liver masses are seen. The gallbladder is normal in appearance with no obvious cholelithiasis. The portal  vein is normal. Pancreas: The pancreatic duct is mildly dilated, measuring up to 3.7 mm, extending into the head of the pancreas. The head of the pancreas also  appears to be somewhat edematous. A small region of low attenuation is seen in the uncinate process on series 10, image 28 measuring 9.6 mm, fluid attenuation in appearance. Spleen: Normal in size without focal abnormality. Adrenals/Urinary Tract: Adrenal glands are unremarkable. Kidneys are normal, without renal calculi, focal lesion, or hydronephrosis. Bladder is unremarkable. Stomach/Bowel: The stomach is normal in appearance. The duodenum is grossly abnormal in the second and third segments. There is abnormal increased mucosal enhancement and marked wall thickening. There is also focal expansion of the duodenum just posterior and inferior to the pancreatic head. There is fluid attenuation in the wall of the duodenum. The small rounded region of fluid attenuation in the uncinate process of the pancreas may connect to the duodenal wall based on coronal image 39. The fourth portion of the duodenum is normal in appearance. Several loops of proximal small bowel are mildly prominent in caliber, likely due to ileus. No convincing evidence of obstruction. Fecal material in the terminal ileum is consistent with slow transit time. The appendix is not visualized but there is no secondary evidence of appendicitis. The colon is normal. Vascular/Lymphatic: Atherosclerotic changes seen in the abdominal aorta and iliac vessels. No aneurysm or dissection. The SMA is encased by the inflammation around the pancreas and duodenum with slight narrowing but no thrombosis. No adenopathy. Reproductive: Prostate is unremarkable. Other: No free air identified. Free fluid is reactive to the process in the right upper abdomen. Musculoskeletal: Vacuum disc phenomena at L4-5 with a disc bulge at this level and probable neural foraminal narrowing bilaterally. No acute fracture or bony erosion. IMPRESSION: 1. The second and third portions of the duodenum are markedly abnormal with wall thickening and mucosal enhancement. There is suspicion for  intramural abscesses in the wall of the duodenum. I also suspect ulcerations but no evidence of perforation. There is inflammation of the adjacent pancreas as well. The duodenum is more abnormal in appearance than the pancreas and I suspect the underlying etiology is a severe duodenitis with secondary affects on the pancreas. Pancreatitis affecting the duodenum is considered less likely. A small fluid collection in the uncinate process may connect with the duodenal wall and could represent a small abscess or pseudocyst. 2. Atherosclerosis in the aorta. 3. Probable developing small-bowel ileus. Findings called to Dr. Jimmye Norman Electronically Signed   By: Dorise Bullion III M.D   On: 07/21/2016 17:25     ASSESSMENT AND PLAN:   55 year old male with past medical history of alcohol abuse who presented to the hospital due to abdominal pain nausea and vomiting.   1. Acute alcoholic pancreatitis-this is the cause of patient's abdominal pain nausea vomiting. Patient was noted to have an elevated lipase could greater than 1000. -Continue supportive care with IV fluids, antiemetics, pain control. Clinically much improved today. Lipase trending down - LFT's stable.  No abdominal pain, N/V now.  - nPO for endoscopy today but will start low fat diet after endoscopy.   2. Duodenitis/duodenal abscess-this was noticed on the CT scan of the abdomen and pelvis on admission. Clinically patient is afebrile, hemodynamically stable. -Gastroenterology consult obtained. Plan for upper GI endoscopy later today.  Continue supportive care for now.  3. Hypokalemia-secondary to the pancreatitis and nausea vomiting. -Continue to replace orally and intravenously. Mg. Level normal range.   4. Alcohol abuse-high risk  for alcohol withdrawal. Continue CIWA protocol. - no evidence of withdrawal for now.   5. Thrombocytopenia - likely due to ETOH abuse.  - can be followed as outpatient. NO acute bleeding presently.   All the  records are reviewed and case discussed with Care Management/Social Worker. Management plans discussed with the patient, family and they are in agreement.  CODE STATUS: Full  DVT Prophylaxis: Lovenox  TOTAL TIME TAKING CARE OF THIS PATIENT: 30 minutes.   POSSIBLE D/C IN 1-2 DAYS, DEPENDING ON CLINICAL CONDITION.   Henreitta Leber M.D on 07/23/2016 at 2:47 PM  Between 7am to 6pm - Pager - [phone]  After 6pm go to [email or web address] - Technical brewer [facility]  Office  [phone]  CC: Primary care physician; No PCP Per Patient

## 2016-07-23 NOTE — Progress Notes (Signed)
Prime doc paged waiting on callback. 

## 2016-07-23 NOTE — Progress Notes (Signed)
Spoke with Dr. Marcille Blanco pt with potassium of 2.5 this morning. Md to place orders.

## 2016-07-23 NOTE — Progress Notes (Signed)
This Probation officer received call from endoscopy notifying that pt's scope is cancelled due to potassium recheck of only 2.8. Was told that Dr. Roselyn Reef would contact attending. This Probation officer paged Dr. Verdell Carmine, notified of information, received order for lowfat diet for pt and potassium 20 meq's po tid for pt. This Probation officer notified pt of this change, pt is given po fluids.

## 2016-07-23 NOTE — Progress Notes (Signed)
Shift assessment completed at 0800 after pt returned from endoscopy. Procedure not completed due to pt's potassium level. Pharmacy has been messaged x2 to send ordered kcl for pt, not yet received. Pt is alert and oriented, has no pain, denied nausea, on room air, lungs are clear, hr is regular, abdomen is soft, bs heard. Pt is oob to bathroom prn to void, ppp, no edema noted. PIV intact to lac with iv ns infusing at 150mls/hr, site is warm and soft, wrapped in kling. Pt stated he feels well enough to go home. Srx2, call bell in reach.

## 2016-07-23 NOTE — Progress Notes (Signed)
Potassium of 2.5 prime doc paged, waiting on callback.

## 2016-07-23 NOTE — Anesthesia Preprocedure Evaluation (Deleted)
Anesthesia Evaluation  Patient identified by MRN, date of birth, ID band Patient awake    Reviewed: Allergy & Precautions, H&P , NPO status , Patient's Chart, lab work & pertinent test results, reviewed documented beta blocker date and time   Airway Mallampati: II   Neck ROM: full    Dental  (+) Poor Dentition   Pulmonary neg pulmonary ROS, Current Smoker,    Pulmonary exam normal        Cardiovascular negative cardio ROS Normal cardiovascular exam Rhythm:regular     Neuro/Psych negative neurological ROS  negative psych ROS   GI/Hepatic negative GI ROS, Neg liver ROS,   Endo/Other  negative endocrine ROS  Renal/GU negative Renal ROS  negative genitourinary   Musculoskeletal   Abdominal   Peds  Hematology negative hematology ROS (+)   Anesthesia Other Findings Past Medical History: No date: Pancreatitis Past Surgical History: No date: APPENDECTOMY BMI    Body Mass Index:  23.30 kg/m     Reproductive/Obstetrics                             Anesthesia Physical Anesthesia Plan  ASA: III  Anesthesia Plan: General   Post-op Pain Management:    Induction:   Airway Management Planned:   Additional Equipment:   Intra-op Plan:   Post-operative Plan:   Informed Consent: I have reviewed the patients History and Physical, chart, labs and discussed the procedure including the risks, benefits and alternatives for the proposed anesthesia with the patient or authorized representative who has indicated his/her understanding and acceptance.   Dental Advisory Given  Plan Discussed with: CRNA  Anesthesia Plan Comments:         Anesthesia Quick Evaluation

## 2016-07-24 LAB — BASIC METABOLIC PANEL
ANION GAP: 6 (ref 5–15)
BUN: 7 mg/dL (ref 6–20)
CALCIUM: 7.5 mg/dL — AB (ref 8.9–10.3)
CO2: 27 mmol/L (ref 22–32)
CREATININE: 0.35 mg/dL — AB (ref 0.61–1.24)
Chloride: 98 mmol/L — ABNORMAL LOW (ref 101–111)
GFR calc Af Amer: >60 mL/min (ref >60)
GFR calc non Af Amer: >60 mL/min (ref >60)
GLUCOSE: 102 mg/dL — AB (ref 65–99)
Potassium: 3.1 mmol/L — ABNORMAL LOW (ref 3.5–5.1)
Sodium: 131 mmol/L — ABNORMAL LOW (ref 135–145)

## 2016-07-24 LAB — MAGNESIUM: Magnesium: 1.9 mg/dL (ref 1.7–2.4)

## 2016-07-24 MED ORDER — FAMOTIDINE 20 MG PO TABS: 20.0000 mg | ORAL_TABLET | Freq: Two times a day (BID) | ORAL | 0 refills | Status: DC

## 2016-07-24 MED ORDER — FAMOTIDINE 20 MG PO TABS
20.0000 mg | ORAL_TABLET | Freq: Two times a day (BID) | ORAL | Status: DC
  Administered 2016-07-24: 20 mg via ORAL
  Filled 2016-07-24: qty 1

## 2016-07-24 NOTE — Discharge Summary (Signed)
[facility] [place] NAME: Jordan Horton    MR#:  [id number]  DATE OF BIRTH:  1961-07-02  DATE OF ADMISSION:  07/21/2016   ADMITTING PHYSICIAN: Ubaldo Glassing Hugelmeyer, DO  DATE OF DISCHARGE: 07/24/2016 11:44 AM  PRIMARY CARE PHYSICIAN: No PCP Per Patient   ADMISSION DIAGNOSIS:  Generalized abdominal pain [R10.84] Duodenitis [K29.80] Acute pancreatitis, unspecified complication status, unspecified pancreatitis type [K85.90] DISCHARGE DIAGNOSIS:  Active Problems:   Pancreatitis, alcoholic, acute   Acute pancreatitis   Duodenitis   Generalized abdominal pain  SECONDARY DIAGNOSIS:   Past Medical History:  Diagnosis Date  . Pancreatitis    HOSPITAL COURSE:   55 year old male with past medical history of alcohol abuse who presented to the hospital due to abdominal pain nausea and vomiting.   1. Acute alcoholic pancreatitis-this is the cause of patient's abdominal pain nausea vomiting. Patient was noted to have an elevated lipase greater than 1000. He was treated with IV fluids, antiemetics, pain control. Clinically much improved today. Lipase trending down - LFT's stable.  No abdominal pain, N/V.   2. Duodenitis/duodenal abscess-this was noticed on the CT scan of the abdomen and pelvis on admission. Clinically patient is afebrile, hemodynamically stable. He was scheduled for endoscopy, which was consult due to low potassium. Dr. Allen Norris will reschedule as outpatient. Continue omeprazole twice a day.  3. Hypokalemia-secondary to the pancreatitis and nausea vomiting. Improving. Potassium was replaced orally and intravenously. Mg. Level normal range.   4. Alcohol abuse-high risk for alcohol withdrawal. Continue CIWA protocol. - no evidence of withdrawal for now.   5. Thrombocytopenia - likely due to ETOH abuse.  - can be followed as outpatient. NO acute bleeding presently.   DISCHARGE CONDITIONS:  Stable, discharged to home  today. CONSULTS OBTAINED:  Treatment Team:  [name], MD DRUG ALLERGIES:  No Known Allergies DISCHARGE MEDICATIONS:     Medication List    TAKE these medications   famotidine 20 MG tablet Commonly known as:  PEPCID Take 1 tablet (20 mg total) by mouth 2 (two) times daily.        DISCHARGE INSTRUCTIONS:   DIET:  Healthy diet DISCHARGE CONDITION:  Stable DISCHARGE LOCATION:     If you experience worsening of your admission symptoms, develop shortness of breath, life threatening emergency, suicidal or homicidal thoughts you must seek medical attention immediately by calling 911 or calling your MD immediately  if symptoms less severe.  You Must read complete instructions/literature along with all the possible adverse reactions/side effects for all the Medicines you take and that have been prescribed to you. Take any new Medicines after you have completely understood and accpet all the possible adverse reactions/side effects.   Please note  You were cared for by a hospitalist during your hospital stay. If you have any questions about your discharge medications or the care you received while you were in the hospital after you are discharged, you can call the unit and asked to speak with the hospitalist on call if the hospitalist that took care of you is not available. Once you are discharged, your primary care physician will handle any further medical issues. Please note that NO REFILLS for any discharge medications will be authorized once you are discharged, as it is imperative that you return to your primary care physician (or establish a relationship with a primary care physician if you do not have one) for your aftercare needs so that they can reassess your need for  medications and monitor your lab values.    On the day of Discharge:  VITAL SIGNS:  Blood pressure (!) 158/95, pulse 90, temperature 99.1 F (37.3 C), temperature source Oral, resp. rate 18, height 5\' 5"  (1.651  m), weight 140 lb (63.5 kg), SpO2 95 %. PHYSICAL EXAMINATION:  GENERAL:  55 y.o.-year-old patient lying in the bed with no acute distress.  EYES: Pupils equal, round, reactive to light and accommodation. No scleral icterus. Extraocular muscles intact.  HEENT: Head atraumatic, normocephalic. Oropharynx and nasopharynx clear.  NECK:  Supple, no jugular venous distention. No thyroid enlargement, no tenderness.  LUNGS: Normal breath sounds bilaterally, no wheezing, rales,rhonchi or crepitation. No use of accessory muscles of respiration.  CARDIOVASCULAR: S1, S2 normal. No murmurs, rubs, or gallops.  ABDOMEN: Soft, non-tender, non-distended. Bowel sounds present. No organomegaly or mass.  EXTREMITIES: No pedal edema, cyanosis, or clubbing.  NEUROLOGIC: Cranial nerves II through XII are intact. Muscle strength 5/5 in all extremities. Sensation intact. Gait not checked.  PSYCHIATRIC: The patient is alert and oriented x 3.  SKIN: No obvious rash, lesion, or ulcer.  DATA REVIEW:   CBC  Recent Labs Lab 07/23/16 0410  WBC 4.4  HGB 13.4  HCT 38.3*  PLT 91*    Chemistries   Recent Labs Lab 07/23/16 0410  07/24/16 0404  NA 134*  --  131*  K 2.5*  < > 3.1*  CL 99*  --  98*  CO2 26  --  27  GLUCOSE 66  --  102*  BUN 8  --  7  CREATININE 0.52*  --  0.35*  CALCIUM 7.6*  --  7.5*  MG  --   --  1.9  AST 33  --   --   ALT 21  --   --   ALKPHOS 56  --   --   BILITOT 1.6*  --   --   < > = values in this interval not displayed.   Microbiology Results  No results found for this or any previous visit.  RADIOLOGY:  No results found.   Management plans discussed with the patient, family and they are in agreement.  CODE STATUS:  Code Status History    Date Active Date Inactive Code Status Order ID Comments User Context   07/21/2016  8:53 PM 07/24/2016  2:50 PM Full Code [id number]  Harvie Bridge, DO Inpatient      TOTAL TIME TAKING CARE OF THIS PATIENT: 32 minutes.    Demetrios Loll M.D on 07/24/2016 at 4:22 PM  Between 7am to 6pm - Pager - [phone]  After 6pm go to [email or web address] - Technical bre[employer] [facility]  Office  [phone]  CC: Primary care physician; No PCP Per Patient   Note: This dictation was prepared with Dragon dictation along with smaller phrase technology. Any transcriptional errors that result from this process are unintentional.

## 2016-07-24 NOTE — Progress Notes (Signed)
Pt being discharged home today. PIV removed. Discharge instructions reviewed with pt, all questions answered. Follow up appointment made with Dr Allen Norris. Prescriptions givent o pt to have filled. He is leaving with all of his belongings. Will be transported home via family.

## 2016-07-24 NOTE — Discharge Instructions (Signed)
Heart healthy diet. Smoking cessation. Alcohol detox.

## 2016-07-24 NOTE — Progress Notes (Signed)
PHARMACIST - PHYSICIAN COMMUNICATION  CONCERNING: IV to Oral Route Change Policy  RECOMMENDATION: This patient is receiving FAMOTIDINE  by the intravenous route.  Based on criteria approved by the Pharmacy and Therapeutics Committee, the intravenous medication(s) is/are being converted to the equivalent oral dose form(s).   DESCRIPTION: These criteria include:  The patient is eating (either orally or via tube) and/or has been taking other orally administered medications for a least 24 hours  The patient has no evidence of active gastrointestinal bleeding or impaired GI absorption (gastrectomy, short bowel, patient on TNA or NPO).  If you have questions about this conversion, please contact the Pharmacy Department  []   [phone] )  Jordan Horton [x]   [phone] )  Jordan Horton, Jordan Horton []   [phone] )  Jordan Horton []   [phone] )  Jordan Horton []   [phone] )  Jordan Horton, Jordan Horton 07/24/2016 7:50 AM

## 2016-07-24 NOTE — Progress Notes (Signed)
An upper endoscopy was attempted twice yesterday with the patient being brought down the morning then scheduled again to have the procedure done in the afternoon. The patient's potassium was too low to do the procedure in the morning. The patient then had a repeat potassium that was not above 3 and the patient could not undergo sedation for the procedure. The patient has been tolerating by mouth's and his pancreatitis has been improving. The patient can follow up as an outpatient and no further inpatient workup will be attempted unless the patient takes a turn for the worse.Jordan Horton

## 2016-08-21 ENCOUNTER — Ambulatory Visit (INDEPENDENT_AMBULATORY_CARE_PROVIDER_SITE_OTHER): Payer: Self-pay | Admitting: Gastroenterology

## 2016-08-21 ENCOUNTER — Encounter: Payer: Self-pay | Admitting: Gastroenterology

## 2016-08-21 ENCOUNTER — Other Ambulatory Visit: Payer: Self-pay

## 2016-08-21 VITALS — BP 123/77 | HR 101 | Temp 98.6°F | Ht 65.0 in | Wt 127.0 lb

## 2016-08-21 DIAGNOSIS — K859 Acute pancreatitis without necrosis or infection, unspecified: Secondary | ICD-10-CM

## 2016-08-21 DIAGNOSIS — K921 Melena: Secondary | ICD-10-CM

## 2016-08-21 NOTE — Progress Notes (Signed)
   Primary Care Physician: No PCP Per Patient  Primary Gastroenterologist:  Dr. [name]  Chief Complaint  Patient presents with  . Hospitalization Follow-up  . Pancreatitis  . Blood In Stools    HPI: Jordan Horton is a 55 y.o. male here for follow-up after being discharged from hospital. The patient was in the hospital for acute pancreatitis induced by alcohol. The patient also had a CT scan that showed thickening of the duodenal wall. The patient now reports that his been having rectal bleeding. The patient states he had a few drinks since he left the hospital but is completely stop drinking after feeling bad with drinking. There is no report of any unexplained weight loss. He also reports that his abdomen has been less distended than it was in the emergency room. He has never had a colonoscopy in the past.  Current Outpatient Prescriptions  Medication Sig Dispense Refill  . famotidine (PEPCID) 20 MG tablet Take 1 tablet (20 mg total) by mouth 2 (two) times daily. 60 tablet 0   No current facility-administered medications for this visit.     Allergies as of 08/21/2016  . (No Known Allergies)    ROS:  General: Negative for anorexia, weight loss, fever, chills, fatigue, weakness. ENT: Negative for hoarseness, difficulty swallowing , nasal congestion. CV: Negative for chest pain, angina, palpitations, dyspnea on exertion, peripheral edema.  Respiratory: Negative for dyspnea at rest, dyspnea on exertion, cough, sputum, wheezing.  GI: See history of present illness. GU:  Negative for dysuria, hematuria, urinary incontinence, urinary frequency, nocturnal urination.  Endo: Negative for unusual weight change.    Physical Examination:   BP 123/77   Pulse (!) 101   Temp 98.6 F (37 C) (Oral)   Ht 5\' 5"  (1.651 m)   Wt 127 lb (57.6 kg)   BMI 21.13 kg/m   General: Well-nourished, well-developed in no acute distress.  Eyes: No icterus. Conjunctivae pink. Mouth:  Oropharyngeal mucosa moist and pink , no lesions erythema or exudate. Lungs: Clear to auscultation bilaterally. Non-labored. Heart: Regular rate and rhythm, no murmurs rubs or gallops.  Abdomen: Bowel sounds are normal, nontender, nondistended, no hepatosplenomegaly or masses, no abdominal bruits or hernia , no rebound or guarding.   Extremities: No lower extremity edema. No clubbing or deformities. Neuro: Alert and oriented x 3.  Grossly intact. Skin: Warm and dry, no jaundice.   Psych: Alert and cooperative, normal mood and affect.  Labs:    Imaging Studies: No results found.  Assessment and Plan:   Jordan Horton is a 55 y.o. y/o male comes in today with a history of admission to the hospital for pancreatitis with an abnormal CT scan of the abdomen showing thickening of the duodenum. The patient also has had recent rectal bleeding. The patient will be set up for an EGD and colonoscopy. I have discussed risks & benefits which include, but are not limited to, bleeding, infection, perforation & drug reaction.  The patient agrees with this plan & written consent will be obtained.       [name], MD. Marval Regal   Note: This dictation was prepared with Dragon dictation along with smaller phrase technology. Any transcriptional errors that result from this process are unintentional.

## 2016-08-26 ENCOUNTER — Encounter: Payer: Self-pay | Admitting: *Deleted

## 2016-08-29 NOTE — Discharge Instructions (Signed)

## 2016-08-30 ENCOUNTER — Ambulatory Visit
Admission: RE | Admit: 2016-08-30 | Discharge: 2016-08-30 | Disposition: A | Discharge status: Home / Self Care | Payer: Self-pay | Source: Ambulatory Visit | Attending: Gastroenterology | Admitting: Gastroenterology

## 2016-08-30 ENCOUNTER — Encounter
Admission: RE | Disposition: A | Discharge status: Home / Self Care | Payer: Self-pay | Source: Ambulatory Visit | Attending: Gastroenterology

## 2016-08-30 ENCOUNTER — Ambulatory Visit: Payer: Self-pay | Admitting: Anesthesiology

## 2016-08-30 DIAGNOSIS — R933 Abnormal findings on diagnostic imaging of other parts of digestive tract: Secondary | ICD-10-CM | POA: Insufficient documentation

## 2016-08-30 DIAGNOSIS — D120 Benign neoplasm of cecum: Secondary | ICD-10-CM

## 2016-08-30 DIAGNOSIS — F17210 Nicotine dependence, cigarettes, uncomplicated: Secondary | ICD-10-CM | POA: Insufficient documentation

## 2016-08-30 DIAGNOSIS — R198 Other specified symptoms and signs involving the digestive system and abdomen: Secondary | ICD-10-CM

## 2016-08-30 DIAGNOSIS — Z9049 Acquired absence of other specified parts of digestive tract: Secondary | ICD-10-CM | POA: Insufficient documentation

## 2016-08-30 DIAGNOSIS — K921 Melena: Secondary | ICD-10-CM

## 2016-08-30 DIAGNOSIS — K635 Polyp of colon: Secondary | ICD-10-CM

## 2016-08-30 DIAGNOSIS — K641 Second degree hemorrhoids: Secondary | ICD-10-CM | POA: Insufficient documentation

## 2016-08-30 DIAGNOSIS — D125 Benign neoplasm of sigmoid colon: Secondary | ICD-10-CM

## 2016-08-30 HISTORY — PX: ESOPHAGOGASTRODUODENOSCOPY (EGD) WITH PROPOFOL: SHX5813

## 2016-08-30 HISTORY — DX: Unspecified osteoarthritis, unspecified site: M19.90

## 2016-08-30 HISTORY — DX: Anodontia: K00.0

## 2016-08-30 HISTORY — PX: POLYPECTOMY: SHX5525

## 2016-08-30 HISTORY — DX: Complete loss of teeth, unspecified cause, unspecified class: K08.109

## 2016-08-30 HISTORY — PX: COLONOSCOPY WITH PROPOFOL: SHX5780

## 2016-08-30 SURGERY — COLONOSCOPY WITH PROPOFOL
Anesthesia: Monitor Anesthesia Care | Wound class: Contaminated

## 2016-08-30 MED ORDER — LIDOCAINE HCL (CARDIAC) 20 MG/ML IV SOLN
INTRAVENOUS | Status: DC | PRN
  Administered 2016-08-30: 40 mg via INTRAVENOUS

## 2016-08-30 MED ORDER — STERILE WATER FOR IRRIGATION IR SOLN
Status: DC | PRN
  Administered 2016-08-30: 11:00:00

## 2016-08-30 MED ORDER — ACETAMINOPHEN 160 MG/5ML PO SOLN: 325.0000 mg | ORAL | Status: DC | PRN

## 2016-08-30 MED ORDER — ACETAMINOPHEN 325 MG PO TABS: 325.0000 mg | ORAL_TABLET | ORAL | Status: DC | PRN

## 2016-08-30 MED ORDER — PROPOFOL 10 MG/ML IV BOLUS
INTRAVENOUS | Status: DC | PRN
  Administered 2016-08-30 (×7): 50 mg via INTRAVENOUS

## 2016-08-30 MED ORDER — GLYCOPYRROLATE 0.2 MG/ML IJ SOLN
INTRAMUSCULAR | Status: DC | PRN
  Administered 2016-08-30: 0.2 mg via INTRAVENOUS

## 2016-08-30 MED ORDER — LACTATED RINGERS IV SOLN
INTRAVENOUS | Status: DC | PRN
  Administered 2016-08-30: 10:00:00 via INTRAVENOUS

## 2016-08-30 SURGICAL SUPPLY — 35 items
BALLN DILATOR 10-12 8 (BALLOONS)
BALLN DILATOR 12-15 8 (BALLOONS)
BALLN DILATOR 15-18 8 (BALLOONS)
BALLN DILATOR CRE 0-12 8 (BALLOONS)
BALLN DILATOR ESOPH 8 10 CRE (MISCELLANEOUS) IMPLANT
BALLOON DILATOR 12-15 8 (BALLOONS) IMPLANT
BALLOON DILATOR 15-18 8 (BALLOONS) IMPLANT
BALLOON DILATOR CRE 0-12 8 (BALLOONS) IMPLANT
BLOCK BITE 60FR ADLT L/F GRN (MISCELLANEOUS) ×3 IMPLANT
CANISTER SUCT 1200ML W/VALVE (MISCELLANEOUS) ×3 IMPLANT
CLIP HMST 235XBRD CATH ROT (MISCELLANEOUS) IMPLANT
CLIP RESOLUTION 360 11X235 (MISCELLANEOUS)
FCP ESCP3.2XJMB 240X2.8X (MISCELLANEOUS)
FORCEPS BIOP RAD 4 LRG CAP 4 (CUTTING FORCEPS) IMPLANT
FORCEPS BIOP RJ4 240 W/NDL (MISCELLANEOUS)
FORCEPS ESCP3.2XJMB 240X2.8X (MISCELLANEOUS) IMPLANT
GOWN CVR UNV OPN BCK APRN NK (MISCELLANEOUS) ×4 IMPLANT
GOWN ISOL THUMB LOOP REG UNIV (MISCELLANEOUS) ×2
INJECTOR VARIJECT VIN23 (MISCELLANEOUS) IMPLANT
KIT DEFENDO VALVE AND CONN (KITS) IMPLANT
KIT ENDO PROCEDURE OLY (KITS) ×3 IMPLANT
MARKER SPOT ENDO TATTOO 5ML (MISCELLANEOUS) IMPLANT
PAD GROUND ADULT SPLIT (MISCELLANEOUS) IMPLANT
PROBE APC STR FIRE (PROBE) IMPLANT
RETRIEVER NET PLAT FOOD (MISCELLANEOUS) IMPLANT
RETRIEVER NET ROTH 2.5X230 LF (MISCELLANEOUS) IMPLANT
SNARE SHORT THROW 13M SML OVAL (MISCELLANEOUS) ×3 IMPLANT
SNARE SHORT THROW 30M LRG OVAL (MISCELLANEOUS) IMPLANT
SNARE SNG USE RND 15MM (INSTRUMENTS) IMPLANT
SPOT EX ENDOSCOPIC TATTOO (MISCELLANEOUS)
SYR INFLATION 60ML (SYRINGE) IMPLANT
TRAP ETRAP POLY (MISCELLANEOUS) ×3 IMPLANT
VARIJECT INJECTOR VIN23 (MISCELLANEOUS)
WATER STERILE IRR 250ML POUR (IV SOLUTION) ×3 IMPLANT
WIRE CRE 18-20MM 8CM F G (MISCELLANEOUS) IMPLANT

## 2016-08-30 NOTE — Transfer of Care (Signed)
Immediate Anesthesia Transfer of Care Note  Patient: Jordan Horton  Procedure(s) Performed: Procedure(s): COLONOSCOPY WITH PROPOFOL (N/A) ESOPHAGOGASTRODUODENOSCOPY (EGD) WITH PROPOFOL (N/A) POLYPECTOMY  Patient Location: PACU  Anesthesia Type: MAC  Level of Consciousness: awake, alert  and patient cooperative  Airway and Oxygen Therapy: Patient Spontanous Breathing and Patient connected to supplemental oxygen  Post-op Assessment: Post-op Vital signs reviewed, Patient's Cardiovascular Status Stable, Respiratory Function Stable, Patent Airway and No signs of Nausea or vomiting  Post-op Vital Signs: Reviewed and stable  Complications: No apparent anesthesia complications

## 2016-08-30 NOTE — Anesthesia Procedure Notes (Signed)
Performed by: Cordaryl Decelles Pre-anesthesia Checklist: Patient identified, Emergency Drugs available, Suction available, Timeout performed and Patient being monitored Patient Re-evaluated:Patient Re-evaluated prior to inductionOxygen Delivery Method: Circle system utilized Preoxygenation: Pre-oxygenation with 100% oxygen Intubation Type: Inhalational induction Ventilation: Mask ventilation without difficulty and Mask ventilation throughout procedure Dental Injury: Teeth and Oropharynx as per pre-operative assessment        

## 2016-08-30 NOTE — Anesthesia Preprocedure Evaluation (Signed)
Anesthesia Evaluation  Patient identified by MRN, date of birth, ID band Patient awake    Reviewed: Allergy & Precautions, H&P , NPO status , Patient's Chart, lab work & pertinent test results  Airway Mallampati: II  TM Distance: >3 FB Neck ROM: full    Dental no notable dental hx.    Pulmonary Current Smoker,     + wheezing      Cardiovascular Normal cardiovascular exam     Neuro/Psych    GI/Hepatic   Endo/Other    Renal/GU      Musculoskeletal   Abdominal   Peds  Hematology   Anesthesia Other Findings B exp Wheeze  Reproductive/Obstetrics                             Anesthesia Physical Anesthesia Plan  ASA: II  Anesthesia Plan: MAC   Post-op Pain Management:    Induction:   Airway Management Planned:   Additional Equipment:   Intra-op Plan:   Post-operative Plan:   Informed Consent: I have reviewed the patients History and Physical, chart, labs and discussed the procedure including the risks, benefits and alternatives for the proposed anesthesia with the patient or authorized representative who has indicated his/her understanding and acceptance.     Plan Discussed with:   Anesthesia Plan Comments:         Anesthesia Quick Evaluation

## 2016-08-30 NOTE — H&P (Signed)
  [name], MD [address] [place],  [address] Phone: [phone] Fax : [phone]  Primary Care Physician:  No PCP Per Patient Primary Gastroenterologist:  Dr. Allen Norris  Pre-Procedure History & Physical: HPI:  Jordan Horton is a 55 y.o. male is here for an endoscopy and colonoscopy.   Past Medical History:  Diagnosis Date  . Arthritis    hands  . 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 Pancreatitis     Past Surgical History:  Procedure Laterality Date  . APPENDECTOMY      Prior to Admission medications   Not on File    Allergies as of 08/21/2016  . (No Known Allergies)    Family History  Problem Relation Age of Onset  . COPD Mother   . Hypertension Mother   . Heart disease Mother   . COPD Father     Social History   Social History  . Marital status: Single    Spouse name: N/A  . Number of children: N/A  . Years of education: N/A   Occupational History  . Not on file.   Social History Main Topics  . Smoking status: Current Every Day Smoker    Packs/day: 1.00    Years: 40.00    Types: Cigarettes  . Smokeless tobacco: Never Used  . Alcohol use Yes     Comment: 6-12 beers daily last drank 07/20/16  . Drug use: No  . Sexual activity: Not on file   Other Topics Concern  . Not on file   Social History Narrative  . No narrative on file    Review of Systems: See HPI, otherwise negative ROS  Physical Exam: BP (!) 157/97   Pulse 97   Temp 98.7 F (37.1 C) (Temporal)   Ht 5\' 5"  (1.651 m)   Wt 129 lb (58.5 kg)   SpO2 97%   BMI 21.47 kg/m  General:   Alert,  pleasant and cooperative in NAD Head:  Normocephalic and atraumatic. Neck:  Supple; no masses or thyromegaly. Lungs:  Clear throughout to auscultation.    Heart:  Regular rate and rhythm. Abdomen:  Soft, nontender and nondistended. Normal bowel sounds, without guarding, and without rebound.   Neurologic:  Alert and  oriented x4;  grossly normal  neurologically.  Impression/Plan: Jordan Horton is here for an endoscopy and colonoscopy to be performed for rectal bleeding and abnormal CT  Risks, benefits, limitations, and alternatives regarding  endoscopy and colonoscopy have been reviewed with the patient.  Questions have been answered.  All parties agreeable.   [name], MD  08/30/2016, 10:55 AM

## 2016-08-30 NOTE — Op Note (Signed)
[facility] Gastroenterology Patient Name: Jordan Horton Procedure Date: 08/30/2016 10:55 AM MRN: [id number] Account #: [id number] Date of Birth: Sep 13, 1961 Admit Type: Outpatient Age: 55 Room: [facility] OR ROOM 01 Gender: Male Note Status: Finalized Procedure:            Colonoscopy Indications:          Hematochezia Providers:            [name] MD, MD Medicines:            Propofol per Anesthesia Complications:        No immediate complications. Procedure:            Pre-Anesthesia Assessment:                       - Prior to the procedure, a History and Physical was                        performed, and patient medications and allergies were                        reviewed. The patient's tolerance of previous                        anesthesia was also reviewed. The risks and benefits of                        the procedure and the sedation options and risks were                        discussed with the patient. All questions were                        answered, and informed consent was obtained. Prior                        Anticoagulants: The patient has taken no previous                        anticoagulant or antiplatelet agents. ASA Grade                        Assessment: II - A patient with mild systemic disease.                        After reviewing the risks and benefits, the patient was                        deemed in satisfactory condition to undergo the                        procedure.                       After obtaining informed consent, the colonoscope was                        passed under direct vision. Throughout the procedure,                        the patient's blood pressure, pulse, and oxygen  saturations were monitored continuously. The Olympus                        CF-HQ190L Colonoscope (S#. [id number]) was introduced                        through the anus and advanced to the the cecum,                         identified by appendiceal orifice and ileocecal valve.                        The colonoscopy was performed without difficulty. The                        patient tolerated the procedure well. The quality of                        the bowel preparation was good. Findings:      The perianal and digital rectal examinations were normal.      A 5 mm polyp was found in the cecum. The polyp was sessile. The polyp       was removed with a cold snare. Resection and retrieval were complete.      Non-bleeding internal hemorrhoids were found during retroflexion. The       hemorrhoids were Grade II (internal hemorrhoids that prolapse but reduce       spontaneously).      A 4 mm polyp was found in the sigmoid colon. The polyp was sessile. The       polyp was removed with a cold snare. Resection and retrieval were       complete. Impression:           - One 5 mm polyp in the cecum, removed with a cold                        snare. Resected and retrieved.                       - Non-bleeding internal hemorrhoids.                       - One 4 mm polyp in the sigmoid colon, removed with a                        cold snare. Resected and retrieved. Recommendation:       - Discharge patient to home.                       - Resume previous diet.                       - Continue present medications. Procedure Code(s):    --- Professional ---                       [phone], Colonoscopy, flexible; with removal of tumor(s),                        polyp(s), or other lesion(s) by snare technique Diagnosis Code(s):    --- Professional ---  K92.1, Melena (includes Hematochezia)                       D12.0, Benign neoplasm of cecum                       D12.5, Benign neoplasm of sigmoid colon CPT copyright 2016 American Medical Association. All rights reserved. The codes documented in this report are preliminary and upon coder review may  be revised to meet current compliance  requirements. [name] MD, MD 08/30/2016 11:22:48 AM This report has been signed electronically. Number of Addenda: 0 Note Initiated On: 08/30/2016 10:55 AM Scope Withdrawal Time: 0 hours 7 minutes 15 seconds  Total Procedure Duration: 0 hours 9 minutes 0 seconds       [facility]

## 2016-08-30 NOTE — Anesthesia Postprocedure Evaluation (Signed)
Anesthesia Post Note  Patient: Jordan Horton  Procedure(s) Performed: Procedure(s) (LRB): COLONOSCOPY WITH PROPOFOL (N/A) ESOPHAGOGASTRODUODENOSCOPY (EGD) WITH PROPOFOL (N/A) POLYPECTOMY  Patient location during evaluation: PACU Anesthesia Type: MAC Level of consciousness: awake and alert and oriented Pain management: satisfactory to patient Vital Signs Assessment: post-procedure vital signs reviewed and stable Respiratory status: spontaneous breathing, nonlabored ventilation and respiratory function stable Cardiovascular status: blood pressure returned to baseline and stable Postop Assessment: Adequate PO intake and No signs of nausea or vomiting Anesthetic complications: no    Raliegh Ip

## 2016-08-30 NOTE — Op Note (Signed)
[facility] Gastroenterology Patient Name: Jordan Horton Procedure Date: 08/30/2016 10:55 AM MRN: [id number] Account #: [id number] Date of Birth: 07/29/1961 Admit Type: Outpatient Age: 55 Room: [facility] OR ROOM 01 Gender: Male Note Status: Finalized Procedure:            Upper GI endoscopy Indications:          Abnormal CT of the GI tract Providers:            [name] MD, MD Medicines:            Propofol per Anesthesia Complications:        No immediate complications. Procedure:            Pre-Anesthesia Assessment:                       - Prior to the procedure, a History and Physical was                        performed, and patient medications and allergies were                        reviewed. The patient's tolerance of previous                        anesthesia was also reviewed. The risks and benefits of                        the procedure and the sedation options and risks were                        discussed with the patient. All questions were                        answered, and informed consent was obtained. Prior                        Anticoagulants: The patient has taken no previous                        anticoagulant or antiplatelet agents. ASA Grade                        Assessment: II - A patient with mild systemic disease.                        After reviewing the risks and benefits, the patient was                        deemed in satisfactory condition to undergo the                        procedure.                       After obtaining informed consent, the endoscope was                        passed under direct vision. Throughout the procedure,                        the  patient's blood pressure, pulse, and oxygen                        saturations were monitored continuously. The was                        introduced through the mouth, and advanced to the                        second part of duodenum. The upper GI endoscopy was                         accomplished without difficulty. The patient tolerated                        the procedure well. Findings:      The examined esophagus was normal.      The entire examined stomach was normal.      The examined duodenum was normal. Impression:           - Normal esophagus.                       - Normal stomach.                       - Normal examined duodenum.                       - No specimens collected. Recommendation:       - Discharge patient to home.                       - Resume previous diet.                       - Continue present medications.                       - Perform a colonoscopy today. Procedure Code(s):    --- Professional ---                       [phone], Esophagogastroduodenoscopy, flexible, transoral;                        diagnostic, including collection of specimen(s) by                        brushing or washing, when performed (separate procedure) Diagnosis Code(s):    --- Professional ---                       R93.3, Abnormal findings on diagnostic imaging of other                        parts of digestive tract CPT copyright 2016 American Medical Association. All rights reserved. The codes documented in this report are preliminary and upon coder review may  be revised to meet current compliance requirements. [name] MD, MD 08/30/2016 11:09:34 AM This report has been signed electronically. Number of Addenda: 0 Note Initiated On: 08/30/2016 10:55 AM Total Procedure Duration: 0 hours 1 minute 42 seconds       [facility]

## 2016-09-02 ENCOUNTER — Encounter: Payer: Self-pay | Admitting: Gastroenterology

## 2016-09-03 ENCOUNTER — Encounter: Payer: Self-pay | Admitting: Gastroenterology

## 2016-09-08 ENCOUNTER — Encounter: Payer: Self-pay | Admitting: Gastroenterology

## 2017-10-13 ENCOUNTER — Emergency Department: Payer: Self-pay

## 2017-10-13 ENCOUNTER — Other Ambulatory Visit: Payer: Self-pay

## 2017-10-13 ENCOUNTER — Encounter: Payer: Self-pay | Admitting: Emergency Medicine

## 2017-10-13 ENCOUNTER — Inpatient Hospital Stay
Admission: EM | Admit: 2017-10-13 | Discharge: 2017-10-17 | DRG: 440 | Disposition: A | Discharge status: Home / Self Care | Payer: Self-pay | Attending: Internal Medicine | Admitting: Internal Medicine

## 2017-10-13 DIAGNOSIS — F17210 Nicotine dependence, cigarettes, uncomplicated: Secondary | ICD-10-CM | POA: Diagnosis present

## 2017-10-13 DIAGNOSIS — Z5320 Procedure and treatment not carried out because of patient's decision for unspecified reasons: Secondary | ICD-10-CM | POA: Diagnosis not present

## 2017-10-13 DIAGNOSIS — R748 Abnormal levels of other serum enzymes: Secondary | ICD-10-CM | POA: Diagnosis present

## 2017-10-13 DIAGNOSIS — Z716 Tobacco abuse counseling: Secondary | ICD-10-CM

## 2017-10-13 DIAGNOSIS — K858 Other acute pancreatitis without necrosis or infection: Principal | ICD-10-CM | POA: Diagnosis present

## 2017-10-13 DIAGNOSIS — K859 Acute pancreatitis without necrosis or infection, unspecified: Secondary | ICD-10-CM | POA: Diagnosis present

## 2017-10-13 DIAGNOSIS — Z7141 Alcohol abuse counseling and surveillance of alcoholic: Secondary | ICD-10-CM

## 2017-10-13 DIAGNOSIS — F1020 Alcohol dependence, uncomplicated: Secondary | ICD-10-CM | POA: Diagnosis present

## 2017-10-13 DIAGNOSIS — K8689 Other specified diseases of pancreas: Secondary | ICD-10-CM | POA: Diagnosis present

## 2017-10-13 DIAGNOSIS — K5900 Constipation, unspecified: Secondary | ICD-10-CM | POA: Diagnosis present

## 2017-10-13 LAB — COMPREHENSIVE METABOLIC PANEL
ALT: 11 U/L — AB (ref 17–63)
AST: 20 U/L (ref 15–41)
Albumin: 4 g/dL (ref 3.5–5.0)
Alkaline Phosphatase: 82 U/L (ref 38–126)
Anion gap: 12 (ref 5–15)
BUN: 13 mg/dL (ref 6–20)
CHLORIDE: 98 mmol/L — AB (ref 101–111)
CO2: 25 mmol/L (ref 22–32)
CREATININE: 0.6 mg/dL — AB (ref 0.61–1.24)
Calcium: 9 mg/dL (ref 8.9–10.3)
GFR calc non Af Amer: >60 mL/min (ref >60)
Glucose, Bld: 112 mg/dL — ABNORMAL HIGH (ref 65–99)
Potassium: 3.8 mmol/L (ref 3.5–5.1)
SODIUM: 135 mmol/L (ref 135–145)
Total Bilirubin: 1.5 mg/dL — ABNORMAL HIGH (ref 0.3–1.2)
Total Protein: 7.2 g/dL (ref 6.5–8.1)

## 2017-10-13 LAB — CBC
HEMATOCRIT: 51 % (ref 40.0–52.0)
Hemoglobin: 17.4 g/dL (ref 13.0–18.0)
MCH: 32 pg (ref 26.0–34.0)
MCHC: 34 g/dL (ref 32.0–36.0)
MCV: 93.9 fL (ref 80.0–100.0)
PLATELETS: 194 10*3/uL (ref 150–440)
RBC: 5.43 MIL/uL (ref 4.40–5.90)
RDW: 13.3 % (ref 11.5–14.5)
WBC: 14.4 10*3/uL — ABNORMAL HIGH (ref 3.8–10.6)

## 2017-10-13 LAB — LIPASE, BLOOD: LIPASE: 2100 U/L — AB (ref 11–51)

## 2017-10-13 LAB — TROPONIN I: Troponin I: 0.03 ng/mL (ref <0.03)

## 2017-10-13 MED ORDER — ACETAMINOPHEN 650 MG RE SUPP: 650.0000 mg | Freq: Four times a day (QID) | RECTAL | Status: DC | PRN

## 2017-10-13 MED ORDER — ADULT MULTIVITAMIN W/MINERALS CH
1.0000 | ORAL_TABLET | Freq: Every day | ORAL | Status: DC
  Administered 2017-10-14 – 2017-10-17 (×4): 1 via ORAL
  Filled 2017-10-13 (×4): qty 1

## 2017-10-13 MED ORDER — HYDROCODONE-ACETAMINOPHEN 5-325 MG PO TABS
1.0000 | ORAL_TABLET | ORAL | Status: DC | PRN
  Administered 2017-10-16 (×2): 1 via ORAL
  Filled 2017-10-13 (×2): qty 1

## 2017-10-13 MED ORDER — ADULT MULTIVITAMIN W/MINERALS CH: 1.0000 | ORAL_TABLET | Freq: Every day | ORAL | Status: DC

## 2017-10-13 MED ORDER — ACETAMINOPHEN 325 MG PO TABS: 650.0000 mg | ORAL_TABLET | Freq: Four times a day (QID) | ORAL | Status: DC | PRN

## 2017-10-13 MED ORDER — FOLIC ACID 1 MG PO TABS
1.0000 mg | ORAL_TABLET | Freq: Every day | ORAL | Status: DC
  Administered 2017-10-14 – 2017-10-17 (×4): 1 mg via ORAL
  Filled 2017-10-13 (×4): qty 1

## 2017-10-13 MED ORDER — MORPHINE SULFATE (PF) 4 MG/ML IV SOLN
4.0000 mg | Freq: Once | INTRAVENOUS | Status: AC
  Administered 2017-10-13: 4 mg via INTRAVENOUS
  Filled 2017-10-13: qty 1

## 2017-10-13 MED ORDER — SODIUM CHLORIDE 0.9 % IV BOLUS (SEPSIS)
1000.0000 mL | Freq: Once | INTRAVENOUS | Status: AC
  Administered 2017-10-13: 1000 mL via INTRAVENOUS

## 2017-10-13 MED ORDER — LACTATED RINGERS IV SOLN
INTRAVENOUS | Status: DC
  Administered 2017-10-13 – 2017-10-16 (×5): via INTRAVENOUS

## 2017-10-13 MED ORDER — IPRATROPIUM-ALBUTEROL 0.5-2.5 (3) MG/3ML IN SOLN: 3.0000 mL | RESPIRATORY_TRACT | Status: DC | PRN

## 2017-10-13 MED ORDER — NICOTINE 14 MG/24HR TD PT24
14.0000 mg | MEDICATED_PATCH | Freq: Every day | TRANSDERMAL | Status: DC
  Administered 2017-10-14 – 2017-10-16 (×3): 14 mg via TRANSDERMAL
  Filled 2017-10-13 (×3): qty 1

## 2017-10-13 MED ORDER — VITAMIN B-1 100 MG PO TABS
100.0000 mg | ORAL_TABLET | Freq: Every day | ORAL | Status: DC
  Administered 2017-10-14 – 2017-10-17 (×4): 100 mg via ORAL
  Filled 2017-10-13 (×4): qty 1

## 2017-10-13 MED ORDER — LORAZEPAM 2 MG/ML IJ SOLN: 1.0000 mg | Freq: Four times a day (QID) | INTRAMUSCULAR | Status: AC | PRN

## 2017-10-13 MED ORDER — MORPHINE SULFATE (PF) 4 MG/ML IV SOLN
4.0000 mg | INTRAVENOUS | Status: DC | PRN
  Administered 2017-10-13 – 2017-10-16 (×29): 4 mg via INTRAVENOUS
  Filled 2017-10-13 (×29): qty 1

## 2017-10-13 MED ORDER — ONDANSETRON HCL 4 MG PO TABS: 4.0000 mg | ORAL_TABLET | Freq: Four times a day (QID) | ORAL | Status: DC | PRN

## 2017-10-13 MED ORDER — PANTOPRAZOLE SODIUM 40 MG IV SOLR
40.0000 mg | Freq: Every day | INTRAVENOUS | Status: DC
Start: 2017-10-13 — End: 2017-10-17
  Administered 2017-10-13 – 2017-10-17 (×4): 40 mg via INTRAVENOUS
  Filled 2017-10-13 (×4): qty 40

## 2017-10-13 MED ORDER — ONDANSETRON HCL 4 MG/2ML IJ SOLN
4.0000 mg | Freq: Four times a day (QID) | INTRAMUSCULAR | Status: DC | PRN
  Administered 2017-10-15: 4 mg via INTRAVENOUS
  Filled 2017-10-13: qty 2

## 2017-10-13 MED ORDER — LORAZEPAM 1 MG PO TABS: 1.0000 mg | ORAL_TABLET | Freq: Four times a day (QID) | ORAL | Status: AC | PRN

## 2017-10-13 MED ORDER — POLYETHYLENE GLYCOL 3350 17 G PO PACK
17.0000 g | PACK | Freq: Every day | ORAL | Status: DC | PRN
Start: 2017-10-13 — End: 2017-10-17

## 2017-10-13 MED ORDER — ONDANSETRON HCL 4 MG/2ML IJ SOLN
4.0000 mg | Freq: Once | INTRAMUSCULAR | Status: AC | PRN
  Administered 2017-10-13: 4 mg via INTRAVENOUS
  Filled 2017-10-13: qty 2

## 2017-10-13 MED ORDER — THIAMINE HCL 100 MG/ML IJ SOLN: 100.0000 mg | Freq: Every day | INTRAMUSCULAR | Status: DC

## 2017-10-13 MED ORDER — IOPAMIDOL (ISOVUE-300) INJECTION 61%
100.0000 mL | Freq: Once | INTRAVENOUS | Status: AC | PRN
  Administered 2017-10-13: 100 mL via INTRAVENOUS

## 2017-10-13 MED ORDER — ENOXAPARIN SODIUM 40 MG/0.4ML ~~LOC~~ SOLN
40.0000 mg | SUBCUTANEOUS | Status: DC
  Administered 2017-10-13 – 2017-10-16 (×4): 40 mg via SUBCUTANEOUS
  Filled 2017-10-13 (×4): qty 0.4

## 2017-10-13 NOTE — ED Provider Notes (Signed)
[facility] Emergency Department Provider Note ___________________________________________   First MD Initiated Contact with Patient 10/13/17 1658     (approximate)  I have reviewed the triage vital signs and the nursing notes.   HISTORY  Chief Complaint Abdominal Pain; Emesis; and Constipation    HPI Jordan Horton is a 56 y.o. male reports he began having abdominal pain about 4 days ago, since that time he is not been able to tolerate eating anything and has been having significant pain in his upper abdomen.  Start able to eat due to the pain.  No fevers or chills.  Some nausea with vomiting.  Decreased bowel movements, but does report still able to have a small bowel moved 3 days ago.  Reports a severe sharp pain in his upper abdomen, feels like his previous "pancreatitis".  Reports severe 10 out of 10 pain, points at his epigastrium.  Past Medical History:  Diagnosis Date  . Arthritis    hands  . 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 Pancreatitis     Patient Active Problem List   Diagnosis Date Noted  . Blood in stool   . Benign neoplasm of cecum   . Polyp of sigmoid colon   . Abnormal findings-gastrointestinal tract   . Acute pancreatitis   . Duodenitis   . Generalized abdominal pain   . Pancreatitis, alcoholic, acute 99/83/3825    Past Surgical History:  Procedure Laterality Date  . APPENDECTOMY    . COLONOSCOPY WITH PROPOFOL N/A 08/30/2016   Procedure: COLONOSCOPY WITH PROPOFOL;  Surgeon: [name], MD;  Location: [facility];  Service: Endoscopy;  Laterality: N/A;  . ESOPHAGOGASTRODUODENOSCOPY (EGD) WITH PROPOFOL N/A 08/30/2016   Procedure: ESOPHAGOGASTRODUODENOSCOPY (EGD) WITH PROPOFOL;  Surgeon: [name], MD;  Location: [facility];  Service: Endoscopy;  Laterality: N/A;  . POLYPECTOMY  08/30/2016   Procedure: POLYPECTOMY;  Surgeon: [name], MD;  Location: [facility];  Service: Endoscopy;;    Prior to  Admission medications   Not on File    Allergies Patient has no known allergies.  Family History  Problem Relation Age of Onset  . COPD Mother   . Hypertension Mother   . Heart disease Mother   . COPD Father     Social History Social History   Tobacco Use  . Smoking status: Current Every Day Smoker    Packs/day: 1.00    Years: 40.00    Pack years: 40.00    Types: Cigarettes  . Smokeless tobacco: Never Used  . Tobacco comment: stopped drinking etoh several months ago  Substance Use Topics  . Alcohol use: No    Frequency: Never    Comment: 6-12 beers daily last drank 07/20/16  . Drug use: No    Review of Systems Constitutional: No fever/chills and fatigue Eyes: No visual changes. ENT: No sore throat. Cardiovascular: Denies chest pain. Respiratory: Denies shortness of breath. Gastrointestinal: See HPI Genitourinary: Negative for dysuria. Musculoskeletal: Negative for back pain. Skin: Negative for rash. Neurological: Negative for headaches, focal weakness or numbness.    ____________________________________________   PHYSICAL EXAM:  VITAL SIGNS: ED Triage Vitals [10/13/17 1510]  Enc Vitals Group     BP 140/88     Pulse Rate 70     Resp 16     Temp 98.2 F (36.8 C)     Temp Source Oral     SpO2 97 %     Weight 140 lb (63.5 kg)     Height  5\' 5"  (1.651 m)     Head Circumference      Peak Flow      Pain Score 7     Pain Loc      Pain Edu?      Excl. in [place]?     Constitutional: Alert and oriented. Well appearing and in no acute distress. Eyes: Conjunctivae are normal. Head: Atraumatic. Nose: No congestion/rhinnorhea. Mouth/Throat: Mucous membranes are moist. Neck: No stridor.   Cardiovascular: Normal rate, regular rhythm. Grossly normal heart sounds.  Good peripheral circulation. Respiratory: Normal respiratory effort.  No retractions. Lungs CTAB. Gastrointestinal: Soft and nontender. No distention. Musculoskeletal: No lower extremity tenderness  nor edema. Neurologic:  Normal speech and language. No gross focal neurologic deficits are appreciated.  Skin:  Skin is warm, dry and intact. No rash noted. Psychiatric: Mood and affect are normal. Speech and behavior are normal.  ____________________________________________   LABS (all labs ordered are listed, but only abnormal results are displayed)  Labs Reviewed  LIPASE, BLOOD - Abnormal; Notable for the following components:      Result Value   Lipase 2,100 (*)    All other components within normal limits  COMPREHENSIVE METABOLIC PANEL - Abnormal; Notable for the following components:   Chloride 98 (*)    Glucose, Bld 112 (*)    Creatinine, Ser 0.60 (*)    ALT 11 (*)    Total Bilirubin 1.5 (*)    All other components within normal limits  CBC - Abnormal; Notable for the following components:   WBC 14.4 (*)    All other components within normal limits  URINALYSIS, COMPLETE (UACMP) WITH MICROSCOPIC   ____________________________________________  EKG  Reviewed interrupt me at 1520 Heart rate 70 QRS 100 QTC 470 Normal sinus rhythm, minimal J-point elevation noted in V2 and V3. ____________________________________________  RADIOLOGY  Ct Abdomen Pelvis W Contrast  Result Date: 10/13/2017 CLINICAL DATA:  Epigastric pain x4 days. History of pancreatitis. Elevated lipase and leukocytosis. EXAM: CT ABDOMEN AND PELVIS WITH CONTRAST TECHNIQUE: Multidetector CT imaging of the abdomen and pelvis was performed using the standard protocol following bolus administration of intravenous contrast. CONTRAST:  157mL ISOVUE-300 IOPAMIDOL (ISOVUE-300) INJECTION 61% COMPARISON:  07/21/2016 FINDINGS: Lower chest: No acute abnormality. Hepatobiliary: No enhancing hepatic lesions. There is a 4 mm hypodensity in the right hepatic lobe too small to further characterize but statistically consistent a cyst or hemangioma. This is not apparent on the prior comparison. No biliary dilatation. Normal  appearing gallbladder without secondary signs of acute cholecystitis. Pancreas: Edematous appearance of the pancreatic head with peripancreatic edema consistent with acute pancreatitis. Scattered calcifications involving the pancreatic gland are consistent with stigmata of chronic pancreatitis. There is pancreatic ductal dilatation up to 6 mm involving the body and tail with smooth tapering within the head of the pancreas. On coronal series 5, image 34 there is a 2 mm calcification potentially within the distal pancreatic duct though this is not conclusive. Spleen: No splenomegaly or mass. Adrenals/Urinary Tract: Normal bilateral adrenal glands, kidneys and ureters. The urinary bladder is unremarkable. No obstructive uropathy is seen. Stomach/Bowel: Edematous inflamed appearing second and third portion of the duodenum with mucosal enhancement similar to prior study without definite perforation. Fluid attenuation within and what appears to be involving the medial wall of the second portion of the duodenum and anterior wall of the third could potentially be secondary to duodenal ulceration. The distal third and fourth portions are normal in caliber. The stomach is distended with fluid possibly  size representing gastroparesis. No bowel obstruction. The appendix is surgically absent. The colon is unremarkable. Vascular/Lymphatic: Patent portal and splenic veins. Moderate aortic atherosclerosis without aneurysm. No lymphadenopathy. Reproductive: Prostate is unremarkable. Other: No abdominopelvic ascites or free air. Musculoskeletal: Chronic grade 1 anterolisthesis of L3 on L4. Associated mild disc space narrowing at L3-4 and L4-5, stable in appearance. No acute osseous appearing abnormality. IMPRESSION: 1. Edematous appearance of the pancreatic head with peripancreatic inflammation consistent with acute pancreatitis. This is superimposed on chronic pancreatitis given pancreatic gland calcifications. Adjacent sympathetic  inflammatory change of the duodenum similar in appearance to prior study. The presence of mucosal ulcers within the second and proximal third portions of the duodenum accounting for fluid attenuation it is not excluded. 2. Pancreatic ductal dilatation up to 6 mm in caliber. There is a calcification that projects within the distal pancreatic duct measuring approximately 2 mm that could potentially be causing this dilatation versus inflammatory change narrowing the lumen of the distal pancreatic duct or chronic stricturing. 3. Probable gastroparesis secondary to adjacent pancreatic inflammation. 4. Tiny 4 mm hypodensity in the right hepatic lobe too small to further characterize but statistically consistent with a cyst or hemangioma. 5. Chronic stable degenerative disc disease L3-4 and L4-5 with grade 1 anterolisthesis of L3 on L4. Electronically Signed   By: Ashley Royalty M.D.   On: 10/13/2017 18:07   CT reviewed, see above report is multiple incidentals.  Does demonstrate acute pancreatitis ____________________________________________   PROCEDURES  Procedure(s) performed: None  Procedures  Critical Care performed: No  ____________________________________________   INITIAL IMPRESSION / ASSESSMENT AND PLAN / ED COURSE  Pertinent labs & imaging results that were available during my care of the patient were reviewed by me and considered in my medical decision making (see chart for details).  Differential diagnosis includes but is not limited to, abdominal perforation, aortic dissection, cholecystitis, appendicitis, diverticulitis, colitis, esophagitis/gastritis, kidney stone, pyelonephritis, urinary tract infection, aortic aneurysm. All are considered in decision and treatment plan. Based upon the patient's presentation and risk factors, will proceed with CT  CT reviews, appears consistent with acute pancreatitis as well as a significantly elevated lipase.  No acute cardiac or pulmonary  symptoms.  Given the patient's extensive pancreatitis, significantly elevated lipase we will keep him n.p.o., provide antiemetics and pain medications.  Admission to the hospital.  ----------------------------------------- 7:00 PM on 10/13/2017 -----------------------------------------  Patient reports his pain is improved, currently resting comfortably.  Awake oriented.  Agreeable with plan for admission       ____________________________________________   FINAL CLINICAL IMPRESSION(S) / ED DIAGNOSES  Final diagnoses:  Other acute pancreatitis without infection or necrosis      NEW MEDICATIONS STARTED DURING THIS VISIT:  This SmartLink is deprecated. Use AVSMEDLIST instead to display the medication list for a patient.   Note:  This document was prepared using Dragon voice recognition software and may include unintentional dictation errors.     Delman Kitten, MD 10/13/17 1900

## 2017-10-13 NOTE — ED Notes (Signed)
When pt falls asleep his oxygen saturation drops. Placed on 2 L nasal cannula and will continue to monitor.

## 2017-10-13 NOTE — H&P (Signed)
[facility] NAME: Jordan Horton    MR#:  [id number]  DATE OF BIRTH:  1961/06/01  DATE OF ADMISSION:  10/13/2017  PRIMARY CARE PHYSICIAN: Patient, No Pcp Per   REQUESTING/REFERRING PHYSICIAN:   CHIEF COMPLAINT:   Chief Complaint  Patient presents with  . Abdominal Pain  . Emesis  . Constipation    HISTORY OF PRESENT ILLNESS: Jordan Horton  is a 56 y.o. male with a known history of pancreatitis secondary to alcoholism presents the emergency room with 4-day history of worsening recurrent abdominal pain radiating into the back associated with decreased p.o. intake and nausea, ER workup noted for acute pancreatitis on CT of the abdomen, white count 14,000, white lipase greater than 2000, patient evaluated in the emergency room, resting comfortably in bed after IV pain medication.  Patient states that he last drank alcohol couple weeks ago, continues to smoke, patient now being admitted for acute on chronic pancreatitis.  PAST MEDICAL HISTORY:   Past Medical History:  Diagnosis Date  . Arthritis    hands  . 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 Pancreatitis     PAST SURGICAL HISTORY:  Past Surgical History:  Procedure Laterality Date  . APPENDECTOMY    . COLONOSCOPY WITH PROPOFOL N/A 08/30/2016   Procedure: COLONOSCOPY WITH PROPOFOL;  Surgeon: [name], MD;  Location: [place];  Service: Endoscopy;  Laterality: N/A;  . ESOPHAGOGASTRODUODENOSCOPY (EGD) WITH PROPOFOL N/A 08/30/2016   Procedure: ESOPHAGOGASTRODUODENOSCOPY (EGD) WITH PROPOFOL;  Surgeon: [name], MD;  Location: [place];  Service: Endoscopy;  Laterality: N/A;  . POLYPECTOMY  08/30/2016   Procedure: POLYPECTOMY;  Surgeon: [name], MD;  Location: [place] [facility];  Service: Endoscopy;;    SOCIAL HISTORY:  Social History   Tobacco Use  . Smoking status: Current Every Day Smoker    Packs/day: 1.00    Years: 40.00    Pack years: 40.00     Types: Cigarettes  . Smokeless tobacco: Never Used  . Tobacco comment: stopped drinking etoh several months ago  Substance Use Topics  . Alcohol use: No    Frequency: Never    Comment: 6-12 beers daily last drank 07/20/16    FAMILY HISTORY:  Family History  Problem Relation Age of Onset  . COPD Mother   . Hypertension Mother   . Heart disease Mother   . COPD Father     DRUG ALLERGIES: No Known Allergies  REVIEW OF SYSTEMS:   CONSTITUTIONAL: No fever, fatigue or weakness.  EYES: No blurred or double vision.  EARS, NOSE, AND THROAT: No tinnitus or ear pain.  RESPIRATORY: No cough, shortness of breath, wheezing or hemoptysis.  CARDIOVASCULAR: No chest pain, orthopnea, edema.  GASTROINTESTINAL: + nausea/decreased p.o. intake/abdominal pain, no vomiting, diarrhea.  GENITOURINARY: No dysuria, hematuria.  ENDOCRINE: No polyuria, nocturia,  HEMATOLOGY: No anemia, easy bruising or bleeding SKIN: No rash or lesion. MUSCULOSKELETAL: No joint pain or arthritis.   NEUROLOGIC: No tingling, numbness, weakness.  PSYCHIATRY: No anxiety or depression.   MEDICATIONS AT HOME:  Prior to Admission medications   Not on File      PHYSICAL EXAMINATION:   VITAL SIGNS: Blood pressure (!) 141/93, pulse (!) 55, temperature 98.2 F (36.8 C), temperature source Oral, resp. rate 12, height 5\' 5"  (1.651 m), weight 63.5 kg (140 lb), SpO2 98 %.  GENERAL:  56 y.o.-year-old patient lying in the bed with no acute distress.  Frail-appearing EYES: Pupils equal, round, reactive  to light and accommodation. No scleral icterus. Extraocular muscles intact.  HEENT: Head atraumatic, normocephalic. Oropharynx and nasopharynx clear.  NECK:  Supple, no jugular venous distention. No thyroid enlargement, no tenderness.  LUNGS: Normal breath sounds bilaterally, no wheezing, rales,rhonchi or crepitation. No use of accessory muscles of respiration.  CARDIOVASCULAR: S1, S2 normal. No murmurs, rubs, or gallops.  ABDOMEN:  Soft, mild diffuse abdominal tenderness, no rebound/guarding, bowel sounds present. No organomegaly or mass.  EXTREMITIES: No pedal edema, cyanosis, or clubbing.  NEUROLOGIC: Cranial nerves II through XII are intact. MAES. Gait not checked.  PSYCHIATRIC: The patient is alert and oriented x 3.  SKIN: No obvious rash, lesion, or ulcer.   LABORATORY PANEL:   CBC Recent Labs  Lab 10/13/17 1515  WBC 14.4*  HGB 17.4  HCT 51.0  PLT 194  MCV 93.9  MCH 32.0  MCHC 34.0  RDW 13.3   ------------------------------------------------------------------------------------------------------------------  Chemistries  Recent Labs  Lab 10/13/17 1515  NA 135  K 3.8  CL 98*  CO2 25  GLUCOSE 112*  BUN 13  CREATININE 0.60*  CALCIUM 9.0  AST 20  ALT 11*  ALKPHOS 82  BILITOT 1.5*   ------------------------------------------------------------------------------------------------------------------ estimated creatinine clearance is 89.7 mL/min (A) (by C-G formula based on SCr of 0.6 mg/dL (L)). ------------------------------------------------------------------------------------------------------------------ No results for input(s): TSH, T4TOTAL, T3FREE, THYROIDAB in the last 72 hours.  Invalid input(s): FREET3   Coagulation profile No results for input(s): INR, PROTIME in the last 168 hours. ------------------------------------------------------------------------------------------------------------------- No results for input(s): DDIMER in the last 72 hours. -------------------------------------------------------------------------------------------------------------------  Cardiac Enzymes No results for input(s): CKMB, TROPONINI, MYOGLOBIN in the last 168 hours.  Invalid input(s): CK ------------------------------------------------------------------------------------------------------------------ Invalid input(s):  POCBNP  ---------------------------------------------------------------------------------------------------------------  Urinalysis    Component Value Date/Time   COLORURINE YELLOW (A) 07/22/2016 1625   APPEARANCEUR CLEAR (A) 07/22/2016 1625   LABSPEC 1.012 07/22/2016 1625   PHURINE 5.0 07/22/2016 1625   GLUCOSEU NEGATIVE 07/22/2016 1625   HGBUR 1+ (A) 07/22/2016 1625   BILIRUBINUR NEGATIVE 07/22/2016 1625   KETONESUR TRACE (A) 07/22/2016 1625   PROTEINUR 30 (A) 07/22/2016 1625   NITRITE NEGATIVE 07/22/2016 1625   LEUKOCYTESUR NEGATIVE 07/22/2016 1625     RADIOLOGY: Ct Abdomen Pelvis W Contrast  Result Date: 10/13/2017 CLINICAL DATA:  Epigastric pain x4 days. History of pancreatitis. Elevated lipase and leukocytosis. EXAM: CT ABDOMEN AND PELVIS WITH CONTRAST TECHNIQUE: Multidetector CT imaging of the abdomen and pelvis was performed using the standard protocol following bolus administration of intravenous contrast. CONTRAST:  113mL ISOVUE-300 IOPAMIDOL (ISOVUE-300) INJECTION 61% COMPARISON:  07/21/2016 FINDINGS: Lower chest: No acute abnormality. Hepatobiliary: No enhancing hepatic lesions. There is a 4 mm hypodensity in the right hepatic lobe too small to further characterize but statistically consistent a cyst or hemangioma. This is not apparent on the prior comparison. No biliary dilatation. Normal appearing gallbladder without secondary signs of acute cholecystitis. Pancreas: Edematous appearance of the pancreatic head with peripancreatic edema consistent with acute pancreatitis. Scattered calcifications involving the pancreatic gland are consistent with stigmata of chronic pancreatitis. There is pancreatic ductal dilatation up to 6 mm involving the body and tail with smooth tapering within the head of the pancreas. On coronal series 5, image 34 there is a 2 mm calcification potentially within the distal pancreatic duct though this is not conclusive. Spleen: No splenomegaly or mass.  Adrenals/Urinary Tract: Normal bilateral adrenal glands, kidneys and ureters. The urinary bladder is unremarkable. No obstructive uropathy is seen. Stomach/Bowel: Edematous inflamed appearing second and third portion of the  duodenum with mucosal enhancement similar to prior study without definite perforation. Fluid attenuation within and what appears to be involving the medial wall of the second portion of the duodenum and anterior wall of the third could potentially be secondary to duodenal ulceration. The distal third and fourth portions are normal in caliber. The stomach is distended with fluid possibly size representing gastroparesis. No bowel obstruction. The appendix is surgically absent. The colon is unremarkable. Vascular/Lymphatic: Patent portal and splenic veins. Moderate aortic atherosclerosis without aneurysm. No lymphadenopathy. Reproductive: Prostate is unremarkable. Other: No abdominopelvic ascites or free air. Musculoskeletal: Chronic grade 1 anterolisthesis of L3 on L4. Associated mild disc space narrowing at L3-4 and L4-5, stable in appearance. No acute osseous appearing abnormality. IMPRESSION: 1. Edematous appearance of the pancreatic head with peripancreatic inflammation consistent with acute pancreatitis. This is superimposed on chronic pancreatitis given pancreatic gland calcifications. 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 The presence of mucosal ulcers within the second and proximal third portions of the duodenum accounting for fluid attenuation it is not excluded. 2. Pancreatic ductal dilatation up to 6 mm in caliber. There is a calcification that projects within the distal pancreatic duct measuring approximately 2 mm that could potentially be causing this dilatation versus inflammatory change narrowing the lumen of the distal pancreatic duct or chronic stricturing. 3. Probable gastroparesis secondary to adjacent pancreatic  inflammation. 4. Tiny 4 mm hypodensity in the right hepatic lobe too small to further characterize but statistically consistent with a cyst or hemangioma. 5. Chronic stable degenerative disc disease L3-4 and L4-5 with grade 1 anterolisthesis of L3 on L4. Electronically Signed   By: Ashley Royalty M.D.   On: 10/13/2017 18:07    EKG: Orders placed or performed during the hospital encounter of 10/13/17  . ED EKG  . ED EKG    IMPRESSION AND PLAN: 1 acute recurrent pancreatitis Most likely exacerbated by alcoholism and chronic tobacco smoking, though cannot rule out possible pancreatic stricture given CT scan findings Admit to regular nursing floor bed, clear liquids for now, consult gastroenterology given concern for possible pancreatic duct stricture, education regarding alcoholism, encouraged tobacco cessation, check lipids in the morning, IV fluids for rehydration, adult pain protocol, and continue close medical monitoring  2 chronic tobacco smoking abuse/dependency Nicotine patch cessation counseling ordered  3 history of alcoholism Stable We will place on alcohol withdrawal protocol  4 acute leukocytosis Most likely secondary to above Conservative observation for now   Full code Condition stable Prognosis fair DVT prophylaxis with Lovenox subcu Disposition Home in 2-3 days pending clinical course  all the records are reviewed and case discussed with ED provider. Management plans discussed with the patient, family and they are in agreement.  CODE STATUS: Code Status History    Date Active Date Inactive Code Status Order ID Comments User Context   07/21/2016 20:53 07/24/2016 14:50 Full Code [id number]  Hugelmeyer, Ubaldo Glassing, DO Inpatient       TOTAL TIME TAKING CARE OF THIS PATIENT: 45 minutes.    Avel Peace Salary M.D on 10/13/2017   Between 7am to 6pm - Pager - [phone]  After 6pm go to [email or web address] - password [email or web address] [facility]  Office   [phone]  CC: Primary care physician; Patient, No Pcp Per   Note: This dictation was prepared with Dragon dictation along with smaller phrase technology. Any transcriptional errors that result from this process are unintentional.

## 2017-10-13 NOTE — ED Notes (Signed)
Pt states hx of pancreatitis. States he has been having abd pain "all over" x 4 days. States vomited Friday but has not since d/t not consuming any food/liquids. Pt screaming out while being assessed. Denies ETOH. Alert, oriented, visitor at bedside. States appendectomy.

## 2017-10-13 NOTE — ED Triage Notes (Signed)
Says abdominal pain all over and it feels like it is locking up.  Also not bm for 3 days.  Says it feels like pancreatitis he had before.

## 2017-10-14 ENCOUNTER — Other Ambulatory Visit: Payer: Self-pay

## 2017-10-14 DIAGNOSIS — K852 Alcohol induced acute pancreatitis without necrosis or infection: Secondary | ICD-10-CM

## 2017-10-14 LAB — CBC
HCT: 45.4 % (ref 40.0–52.0)
HEMOGLOBIN: 15.3 g/dL (ref 13.0–18.0)
MCH: 32.2 pg (ref 26.0–34.0)
MCHC: 33.8 g/dL (ref 32.0–36.0)
MCV: 95.5 fL (ref 80.0–100.0)
PLATELETS: 150 10*3/uL (ref 150–440)
RBC: 4.75 MIL/uL (ref 4.40–5.90)
RDW: 13.5 % (ref 11.5–14.5)
WBC: 9.4 10*3/uL (ref 3.8–10.6)

## 2017-10-14 LAB — LIPID PANEL
CHOLESTEROL: 109 mg/dL (ref 0–200)
HDL: 27 mg/dL — ABNORMAL LOW (ref >40)
LDL Cholesterol: 61 mg/dL (ref 0–99)
TRIGLYCERIDES: 104 mg/dL (ref <150)
Total CHOL/HDL Ratio: 4 RATIO
VLDL: 21 mg/dL (ref 0–40)

## 2017-10-14 LAB — BASIC METABOLIC PANEL
Anion gap: 6 (ref 5–15)
BUN: 9 mg/dL (ref 6–20)
CALCIUM: 8.2 mg/dL — AB (ref 8.9–10.3)
CHLORIDE: 99 mmol/L — AB (ref 101–111)
CO2: 29 mmol/L (ref 22–32)
CREATININE: 0.61 mg/dL (ref 0.61–1.24)
Glucose, Bld: 88 mg/dL (ref 65–99)
Potassium: 3.5 mmol/L (ref 3.5–5.1)
SODIUM: 134 mmol/L — AB (ref 135–145)

## 2017-10-14 LAB — LIPASE, BLOOD: LIPASE: 310 U/L — AB (ref 11–51)

## 2017-10-14 NOTE — Consult Note (Signed)
[name], MD [address] [place], [place] [address] Phone: [phone] Fax : [phone]  Consultation  Referring Provider:     Dr. Jerelyn Charles Primary Care Physician:  Patient, No Pcp Per Primary Gastroenterologist:  Dr. Allen Norris         Reason for Consultation:     Acute on chronic pancreatitis  Date of Admission:  10/13/2017 Date of Consultation:  10/14/2017         HPI:   Jordan Horton is a 57 y.o. male who has a history of alcohol abuse and was admitted with increased lipase. The patient also had abdominal pain that has been present since Christmas. The patient had pancreatic has back in October 2017 and states he stopped his heavy drinking at that time but still does have some alcohol but states his last drink was a few weeks ago. The patient's lipase on admission was 2100 yesterday and is down to 310 today. He states his pain is better now but is not sure if it's because the pain is getting better or his pain medication is making it less. The patient had a CT scan that showed dilation of the pancreatic duct with a possible 2 mm calcification in the duct. The patient has been tolerating clear liquid diet and chicken broth this morning. There is no report of any black stools or bloody stools. The patient also was found to have a white cell count of 14.4 on admission that is down to 9.4 this morning. The patient's CBC is completely normal this morning. The patient had an EGD and colonoscopy a little over a year ago for an abnormal CT scan that did not show any findings corresponding with the CT scan.  Past Medical History:  Diagnosis Date  . Arthritis    hands  . 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 Pancreatitis     Past Surgical History:  Procedure Laterality Date  . APPENDECTOMY    . COLONOSCOPY WITH PROPOFOL N/A 08/30/2016   Procedure: COLONOSCOPY WITH PROPOFOL;  Surgeon: [name], MD;  Location: [facility];  Service: Endoscopy;  Laterality: N/A;  .  ESOPHAGOGASTRODUODENOSCOPY (EGD) WITH PROPOFOL N/A 08/30/2016   Procedure: ESOPHAGOGASTRODUODENOSCOPY (EGD) WITH PROPOFOL;  Surgeon: [name], MD;  Location: [facility];  Service: Endoscopy;  Laterality: N/A;  . POLYPECTOMY  08/30/2016   Procedure: POLYPECTOMY;  Surgeon: [name], MD;  Location: [facility];  Service: Endoscopy;;    Prior to Admission medications   Not on File    Family History  Problem Relation Age of Onset  . COPD Mother   . Hypertension Mother   . Heart disease Mother   . COPD Father      Social History   Tobacco Use  . Smoking status: Current Every Day Smoker    Packs/day: 1.00    Years: 40.00    Pack years: 40.00    Types: Cigarettes  . Smokeless tobacco: Never Used  . Tobacco comment: stopped drinking etoh several months ago  Substance Use Topics  . Alcohol use: No    Frequency: Never    Comment: 6-12 beers daily last drank 07/20/16  . Drug use: No    Allergies as of 10/13/2017  . (No Known Allergies)    Review of Systems:    All systems reviewed and negative except where noted in HPI.   Physical Exam:  Vital signs in last 24 hours: Temp:  [98.2 F (36.8 C)-99 F (37.2 C)] 98.9  F (37.2 C) (01/01 0802) Pulse Rate:  [53-71] 66 (01/01 0802) Resp:  [10-22] 16 (01/01 0802) BP: (133-166)/(87-97) 145/87 (01/01 0802) SpO2:  [90 %-98 %] 91 % (01/01 0802) Weight:  [127 lb 12.8 oz (58 kg)-140 lb (63.5 kg)] 127 lb 12.8 oz (58 kg) (12/31 2035) Last BM Date: 10/09/17 General:   Pleasant, cooperative in NAD Head:  Normocephalic and atraumatic. Eyes:   No icterus.   Conjunctiva pink. PERRLA. Ears:  Normal auditory acuity. Neck:  Supple; no masses or thyroidomegaly Lungs: Respirations even and unlabored. Lungs clear to auscultation bilaterally.   No wheezes, crackles, or rhonchi.  Heart:  Regular rate and rhythm;  Without murmur, clicks, rubs or gallops Abdomen:  Soft, nondistended, positive epigastric pain. Normal bowel  sounds. No appreciable masses or hepatomegaly.  No rebound or guarding.  Rectal:  Not performed. Msk:  Symmetrical without gross deformities.    Extremities:  Without edema, cyanosis or clubbing. Neurologic:  Alert and oriented x3;  grossly normal neurologically. Skin:  Intact without significant lesions or rashes. Cervical Nodes:  No significant cervical adenopathy. Psych:  Alert and cooperative. Normal affect.  LAB RESULTS: Recent Labs    10/13/17 1515 10/14/17 0437  WBC 14.4* 9.4  HGB 17.4 15.3  HCT 51.0 45.4  PLT 194 150   BMET Recent Labs    10/13/17 1515 10/14/17 0437  NA 135 134*  K 3.8 3.5  CL 98* 99*  CO2 25 29  GLUCOSE 112* 88  BUN 13 9  CREATININE 0.60* 0.61  CALCIUM 9.0 8.2*   LFT Recent Labs    10/13/17 1515  PROT 7.2  ALBUMIN 4.0  AST 20  ALT 11*  ALKPHOS 82  BILITOT 1.5*   PT/INR No results for input(s): LABPROT, INR in the last 72 hours.  STUDIES: Ct Abdomen Pelvis W Contrast  Result Date: 10/13/2017 CLINICAL DATA:  Epigastric pain x4 days. History of pancreatitis. Elevated lipase and leukocytosis. EXAM: CT ABDOMEN AND PELVIS WITH CONTRAST TECHNIQUE: Multidetector CT imaging of the abdomen and pelvis was performed using the standard protocol following bolus administration of intravenous contrast. CONTRAST:  162mL ISOVUE-300 IOPAMIDOL (ISOVUE-300) INJECTION 61% COMPARISON:  07/21/2016 FINDINGS: Lower chest: No acute abnormality. Hepatobiliary: No enhancing hepatic lesions. There is a 4 mm hypodensity in the right hepatic lobe too small to further characterize but statistically consistent a cyst or hemangioma. This is not apparent on the prior comparison. No biliary dilatation. Normal appearing gallbladder without secondary signs of acute cholecystitis. Pancreas: Edematous appearance of the pancreatic head with peripancreatic edema consistent with acute pancreatitis. Scattered calcifications involving the pancreatic gland are consistent with stigmata  of chronic pancreatitis. There is pancreatic ductal dilatation up to 6 mm involving the body and tail with smooth tapering within the head of the pancreas. On coronal series 5, image 34 there is a 2 mm calcification potentially within the distal pancreatic duct though this is not conclusive. Spleen: No splenomegaly or mass. Adrenals/Urinary Tract: Normal bilateral adrenal glands, kidneys and ureters. The urinary bladder is unremarkable. No obstructive uropathy is seen. Stomach/Bowel: Edematous inflamed appearing second and third portion of the duodenum with mucosal enhancement similar to prior study without definite perforation. Fluid attenuation within and what appears to be involving the medial wall of the second portion of the duodenum and anterior wall of the third could potentially be secondary to duodenal ulceration. The distal third and fourth portions are normal in caliber. The stomach is distended with fluid possibly size representing gastroparesis. No bowel obstruction.  The appendix is surgically absent. The colon is unremarkable. Vascular/Lymphatic: Patent portal and splenic veins. Moderate aortic atherosclerosis without aneurysm. No lymphadenopathy. Reproductive: Prostate is unremarkable. Other: No abdominopelvic ascites or free air. Musculoskeletal: Chronic grade 1 anterolisthesis of L3 on L4. Associated mild disc space narrowing at L3-4 and L4-5, stable in appearance. No acute osseous appearing abnormality. IMPRESSION: 1. Edematous appearance of the pancreatic head with peripancreatic inflammation consistent with acute pancreatitis. This is superimposed on chronic pancreatitis given pancreatic gland calcifications. 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 The presence of mucosal ulcers within the second and proximal third portions of the duodenum accounting for fluid attenuation it is not excluded. 2. Pancreatic ductal dilatation up to 6 mm in  caliber. There is a calcification that projects within the distal pancreatic duct measuring approximately 2 mm that could potentially be causing this dilatation versus inflammatory change narrowing the lumen of the distal pancreatic duct or chronic stricturing. 3. Probable gastroparesis secondary to adjacent pancreatic inflammation. 4. Tiny 4 mm hypodensity in the right hepatic lobe too small to further characterize but statistically consistent with a cyst or hemangioma. 5. Chronic stable degenerative disc disease L3-4 and L4-5 with grade 1 anterolisthesis of L3 on L4. Electronically Signed   By: Ashley Royalty M.D.   On: 10/13/2017 18:07      Impression / Plan:   Jordan Horton is a 57 y.o. y/o male with acute on chronic pancreatic head is with a possible pancreatic duct stone with dilation of pancreatic duct. There is also calcifications indicative of chronic pancreatitis with CT scan finding showing acute pancreatic status. The patient's lipase is gone from 2100 to 310 overnight. The patient should have his diet advanced when his pain has resolved. The patient should then follow up at a tertiary care center for his pancreatic duct calcification. This is not something that we do at this facility. The patient has been encouraged to stop all alcohol use. When his diet is advanced she should be put on a low fat diet. The patient denies any fatty stools or weight loss. I would continue conservative management of this patient. Nothing further to add from a GI point of view.  Thank you for involving me in the care of this patient.      LOS: 1 day   [name], MD  10/14/2017, 10:28 AM   Note: This dictation was prepared with Dragon dictation along with smaller phrase technology. Any transcriptional errors that result from this process are unintentional.

## 2017-10-14 NOTE — Progress Notes (Signed)
[facility] [place] NAME: Jordan Horton    MR#:  [id number]  DATE OF BIRTH:  [birth year]  SUBJECTIVE:  CHIEF COMPLAINT:   Chief Complaint  Patient presents with  . Abdominal Pain  . Emesis  . Constipation    Ch pancreatitis, came after alcohol use and pain again.  Little better today, tolerated liquid diet.  REVIEW OF SYSTEMS:  CONSTITUTIONAL: No fever, fatigue or weakness.  EYES: No blurred or double vision.  EARS, NOSE, AND THROAT: No tinnitus or ear pain.  RESPIRATORY: No cough, shortness of breath, wheezing or hemoptysis.  CARDIOVASCULAR: No chest pain, orthopnea, edema.  GASTROINTESTINAL: No nausea, vomiting, diarrhea or abdominal pain.  GENITOURINARY: No dysuria, hematuria.  ENDOCRINE: No polyuria, nocturia,  HEMATOLOGY: No anemia, easy bruising or bleeding SKIN: No rash or lesion. MUSCULOSKELETAL: No joint pain or arthritis.   NEUROLOGIC: No tingling, numbness, weakness.  PSYCHIATRY: No anxiety or depression.   ROS  DRUG ALLERGIES:  No Known Allergies  VITALS:  Blood pressure (!) 145/87, pulse 66, temperature 98.9 F (37.2 C), temperature source Oral, resp. rate 16, height 5\' 5"  (1.651 m), weight 58 kg (127 lb 12.8 oz), SpO2 91 %.  PHYSICAL EXAMINATION:  GENERAL:  57 y.o.-year-old patient lying in the bed with no acute distress.  EYES: Pupils equal, round, reactive to light and accommodation. No scleral icterus. Extraocular muscles intact.  HEENT: Head atraumatic, normocephalic. Oropharynx and nasopharynx clear.  NECK:  Supple, no jugular venous distention. No thyroid enlargement, no tenderness.  LUNGS: Normal breath sounds bilaterally, no wheezing, rales,rhonchi or crepitation. No use of accessory muscles of respiration.  CARDIOVASCULAR: S1, S2 normal. No murmurs, rubs, or gallops.  ABDOMEN: Soft, tender, nondistended. Bowel sounds present. No organomegaly or mass.  EXTREMITIES: No pedal edema, cyanosis, or  clubbing.  NEUROLOGIC: Cranial nerves II through XII are intact. Muscle strength 5/5 in all extremities. Sensation intact. Gait not checked.  PSYCHIATRIC: The patient is alert and oriented x 3.  SKIN: No obvious rash, lesion, or ulcer.   Physical Exam LABORATORY PANEL:   CBC Recent Labs  Lab 10/14/17 0437  WBC 9.4  HGB 15.3  HCT 45.4  PLT 150   ------------------------------------------------------------------------------------------------------------------  Chemistries  Recent Labs  Lab 10/13/17 1515 10/14/17 0437  NA 135 134*  K 3.8 3.5  CL 98* 99*  CO2 25 29  GLUCOSE 112* 88  BUN 13 9  CREATININE 0.60* 0.61  CALCIUM 9.0 8.2*  AST 20  --   ALT 11*  --   ALKPHOS 82  --   BILITOT 1.5*  --    ------------------------------------------------------------------------------------------------------------------  Cardiac Enzymes Recent Labs  Lab 10/13/17 1515 10/13/17 1913  TROPONINI 0.03* <0.03   ------------------------------------------------------------------------------------------------------------------  RADIOLOGY:  Ct Abdomen Pelvis W Contrast  Result Date: 10/13/2017 CLINICAL DATA:  Epigastric pain x4 days. History of pancreatitis. Elevated lipase and leukocytosis. EXAM: CT ABDOMEN AND PELVIS WITH CONTRAST TECHNIQUE: Multidetector CT imaging of the abdomen and pelvis was performed using the standard protocol following bolus administration of intravenous contrast. CONTRAST:  13mL ISOVUE-300 IOPAMIDOL (ISOVUE-300) INJECTION 61% COMPARISON:  07/21/2016 FINDINGS: Lower chest: No acute abnormality. Hepatobiliary: No enhancing hepatic lesions. There is a 4 mm hypodensity in the right hepatic lobe too small to further characterize but statistically consistent a cyst or hemangioma. This is not apparent on the prior comparison. No biliary dilatation. Normal appearing gallbladder without secondary signs of acute cholecystitis. Pancreas: Edematous appearance of the  pancreatic head with peripancreatic edema consistent  with acute pancreatitis. Scattered calcifications involving the pancreatic gland are consistent with stigmata of chronic pancreatitis. There is pancreatic ductal dilatation up to 6 mm involving the body and tail with smooth tapering within the head of the pancreas. On coronal series 5, image 34 there is a 2 mm calcification potentially within the distal pancreatic duct though this is not conclusive. Spleen: No splenomegaly or mass. Adrenals/Urinary Tract: Normal bilateral adrenal glands, kidneys and ureters. The urinary bladder is unremarkable. No obstructive uropathy is seen. Stomach/Bowel: Edematous inflamed appearing second and third portion of the duodenum with mucosal enhancement similar to prior study without definite perforation. Fluid attenuation within and what appears to be involving the medial wall of the second portion of the duodenum and anterior wall of the third could potentially be secondary to duodenal ulceration. The distal third and fourth portions are normal in caliber. The stomach is distended with fluid possibly size representing gastroparesis. No bowel obstruction. The appendix is surgically absent. The colon is unremarkable. Vascular/Lymphatic: Patent portal and splenic veins. Moderate aortic atherosclerosis without aneurysm. No lymphadenopathy. Reproductive: Prostate is unremarkable. Other: No abdominopelvic ascites or free air. Musculoskeletal: Chronic grade 1 anterolisthesis of L3 on L4. Associated mild disc space narrowing at L3-4 and L4-5, stable in appearance. No acute osseous appearing abnormality. IMPRESSION: 1. Edematous appearance of the pancreatic head with peripancreatic inflammation consistent with acute pancreatitis. This is superimposed on chronic pancreatitis given pancreatic gland calcifications. 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 The presence of mucosal ulcers  within the second and proximal third portions of the duodenum accounting for fluid attenuation it is not excluded. 2. Pancreatic ductal dilatation up to 6 mm in caliber. There is a calcification that projects within the distal pancreatic duct measuring approximately 2 mm that could potentially be causing this dilatation versus inflammatory change narrowing the lumen of the distal pancreatic duct or chronic stricturing. 3. Probable gastroparesis secondary to adjacent pancreatic inflammation. 4. Tiny 4 mm hypodensity in the right hepatic lobe too small to further characterize but statistically consistent with a cyst or hemangioma. 5. Chronic stable degenerative disc disease L3-4 and L4-5 with grade 1 anterolisthesis of L3 on L4. Electronically Signed   By: Ashley Royalty M.D.   On: 10/13/2017 18:07    ASSESSMENT AND PLAN:   Active Problems:   Pancreatitis  * Acute pancreatitis   Lipase is coming down   IV fluid and pain management continued.   Appreciated help by GI, as he had some stricturing pancreatic duct he need to follow a tertiary care center. He is followed at [facility] in the past. I advised to follow up at [facility] in next few weeks.  * History of alcoholism   No signs of withdrawal, counseled to quit.  * Active smoker   Counseled to quit smoking for 4 minutes and offered nicotine patch.  * Leukocytosis   Most likely due to pancreatitis, coming down now.  All the records are reviewed and case discussed with Care Management/Social Workerr. Management plans discussed with the patient, family and they are in agreement.  CODE STATUS: Full code.  TOTAL TIME TAKING CARE OF THIS PATIENT: 35 minutes.     POSSIBLE D/C IN 1-2 DAYS, DEPENDING ON CLINICAL CONDITION.   Vaughan Basta M.D on 10/14/2017   Between 7am to 6pm - Pager - [phone]  After 6pm go to [email or web address] - password [email or web address]  Office  [phone]  CC: Primary care physician; Patient,  No  Pcp Per  Note: This dictation was prepared with Dragon dictation along with smaller phrase technology. Any transcriptional errors that result from this process are unintentional.

## 2017-10-14 NOTE — Progress Notes (Signed)
Pt alert and oriented X4. Resting in room. Pt requesting morphine every 2 hours for abdominal pain. Pt on room air. IV infusing. Visitors at bedside. No other complaints at this time.

## 2017-10-15 ENCOUNTER — Inpatient Hospital Stay: Payer: Self-pay

## 2017-10-15 LAB — LIPASE, BLOOD: LIPASE: 130 U/L — AB (ref 11–51)

## 2017-10-15 MED ORDER — IOPAMIDOL (ISOVUE-300) INJECTION 61%
15.0000 mL | INTRAVENOUS | Status: AC
  Administered 2017-10-15: 15 mL via ORAL

## 2017-10-15 MED ORDER — IOPAMIDOL (ISOVUE-300) INJECTION 61%
75.0000 mL | Freq: Once | INTRAVENOUS | Status: AC | PRN
  Administered 2017-10-15: 75 mL via INTRAVENOUS

## 2017-10-15 NOTE — Plan of Care (Signed)
  Education: Knowledge of General Education information will improve 10/15/2017 0516 - Progressing by Darletta Noblett, Lucille Passy, RN

## 2017-10-15 NOTE — Progress Notes (Signed)
[place] [facility] [place] NAME: Jordan Horton    MR#:  [id number]  DATE OF BIRTH:  05-Apr-1961  SUBJECTIVE:  CHIEF COMPLAINT:   Chief Complaint  Patient presents with  . Abdominal Pain  . Emesis  . Constipation    Ch pancreatitis, came after alcohol use and pain again.   Could not tolerate soft diet.  Reporting 9 out of 10 abdominal pain.  REVIEW OF SYSTEMS:  CONSTITUTIONAL: No fever, fatigue or weakness.  EYES: No blurred or double vision.  EARS, NOSE, AND THROAT: No tinnitus or ear pain.  RESPIRATORY: No cough, shortness of breath, wheezing or hemoptysis.  CARDIOVASCULAR: No chest pain, orthopnea, edema.  GASTROINTESTINAL: No nausea, vomiting, diarrhea.  Reporting severe epigastric abdominal pain.  GENITOURINARY: No dysuria, hematuria.  ENDOCRINE: No polyuria, nocturia,  HEMATOLOGY: No anemia, easy bruising or bleeding SKIN: No rash or lesion. MUSCULOSKELETAL: No joint pain or arthritis.   NEUROLOGIC: No tingling, numbness, weakness.  PSYCHIATRY: No anxiety or depression.   ROS  DRUG ALLERGIES:  No Known Allergies  VITALS:  Blood pressure (!) 139/91, pulse 94, temperature 99.2 F (37.3 C), temperature source Oral, resp. rate 16, height 5\' 5"  (1.651 m), weight 58 kg (127 lb 12.8 oz), SpO2 92 %.  PHYSICAL EXAMINATION:  GENERAL:  57 y.o.-year-old patient lying in the bed with no acute distress.  EYES: Pupils equal, round, reactive to light and accommodation. No scleral icterus. Extraocular muscles intact.  HEENT: Head atraumatic, normocephalic. Oropharynx and nasopharynx clear.  NECK:  Supple, no jugular venous distention. No thyroid enlargement, no tenderness.  LUNGS: Normal breath sounds bilaterally, no wheezing, rales,rhonchi or crepitation. No use of accessory muscles of respiration.  CARDIOVASCULAR: S1, S2 normal. No murmurs, rubs, or gallops.  ABDOMEN: Soft, tender, nondistended. Bowel sounds present.  No rebound tenderness  no organomegaly or mass.  EXTREMITIES: No pedal edema, cyanosis, or clubbing.  NEUROLOGIC: Cranial nerves II through XII are intact. Muscle strength 5/5 in all extremities. Sensation intact. Gait not checked.  PSYCHIATRIC: The patient is alert and oriented x 3.  SKIN: No obvious rash, lesion, or ulcer.   Physical Exam LABORATORY PANEL:   CBC Recent Labs  Lab 10/14/17 0437  WBC 9.4  HGB 15.3  HCT 45.4  PLT 150   ------------------------------------------------------------------------------------------------------------------  Chemistries  Recent Labs  Lab 10/13/17 1515 10/14/17 0437  NA 135 134*  K 3.8 3.5  CL 98* 99*  CO2 25 29  GLUCOSE 112* 88  BUN 13 9  CREATININE 0.60* 0.61  CALCIUM 9.0 8.2*  AST 20  --   ALT 11*  --   ALKPHOS 82  --   BILITOT 1.5*  --    ------------------------------------------------------------------------------------------------------------------  Cardiac Enzymes Recent Labs  Lab 10/13/17 1515 10/13/17 1913  TROPONINI 0.03* <0.03   ------------------------------------------------------------------------------------------------------------------  RADIOLOGY:  Ct Abdomen Pelvis W Contrast  Result Date: 10/13/2017 CLINICAL DATA:  Epigastric pain x4 days. History of pancreatitis. Elevated lipase and leukocytosis. EXAM: CT ABDOMEN AND PELVIS WITH CONTRAST TECHNIQUE: Multidetector CT imaging of the abdomen and pelvis was performed using the standard protocol following bolus administration of intravenous contrast. CONTRAST:  179mL ISOVUE-300 IOPAMIDOL (ISOVUE-300) INJECTION 61% COMPARISON:  07/21/2016 FINDINGS: Lower chest: No acute abnormality. Hepatobiliary: No enhancing hepatic lesions. There is a 4 mm hypodensity in the right hepatic lobe too small to further characterize but statistically consistent a cyst or hemangioma. This is not apparent on the prior comparison. No biliary dilatation. Normal appearing gallbladder without secondary signs  of acute cholecystitis. Pancreas: Edematous appearance of the pancreatic head with peripancreatic edema consistent with acute pancreatitis. Scattered calcifications involving the pancreatic gland are consistent with stigmata of chronic pancreatitis. There is pancreatic ductal dilatation up to 6 mm involving the body and tail with smooth tapering within the head of the pancreas. On coronal series 5, image 34 there is a 2 mm calcification potentially within the distal pancreatic duct though this is not conclusive. Spleen: No splenomegaly or mass. Adrenals/Urinary Tract: Normal bilateral adrenal glands, kidneys and ureters. The urinary bladder is unremarkable. No obstructive uropathy is seen. Stomach/Bowel: Edematous inflamed appearing second and third portion of the duodenum with mucosal enhancement similar to prior study without definite perforation. Fluid attenuation within and what appears to be involving the medial wall of the second portion of the duodenum and anterior wall of the third could potentially be secondary to duodenal ulceration. The distal third and fourth portions are normal in caliber. The stomach is distended with fluid possibly size representing gastroparesis. No bowel obstruction. The appendix is surgically absent. The colon is unremarkable. Vascular/Lymphatic: Patent portal and splenic veins. Moderate aortic atherosclerosis without aneurysm. No lymphadenopathy. Reproductive: Prostate is unremarkable. Other: No abdominopelvic ascites or free air. Musculoskeletal: Chronic grade 1 anterolisthesis of L3 on L4. Associated mild disc space narrowing at L3-4 and L4-5, stable in appearance. No acute osseous appearing abnormality. IMPRESSION: 1. Edematous appearance of the pancreatic head with peripancreatic inflammation consistent with acute pancreatitis. This is superimposed on chronic pancreatitis given pancreatic gland calcifications. 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 The presence of mucosal ulcers within the second and proximal third portions of the duodenum accounting for fluid attenuation it is not excluded. 2. Pancreatic ductal dilatation up to 6 mm in caliber. There is a calcification that projects within the distal pancreatic duct measuring approximately 2 mm that could potentially be causing this dilatation versus inflammatory change narrowing the lumen of the distal pancreatic duct or chronic stricturing. 3. Probable gastroparesis secondary to adjacent pancreatic inflammation. 4. Tiny 4 mm hypodensity in the right hepatic lobe too small to further characterize but statistically consistent with a cyst or hemangioma. 5. Chronic stable degenerative disc disease L3-4 and L4-5 with grade 1 anterolisthesis of L3 on L4. Electronically Signed   By: Ashley Royalty M.D.   On: 10/13/2017 18:07    ASSESSMENT AND PLAN:   Active Problems:   Pancreatitis  * Acute pancreatitis on chronic pancreatitis   Lipase is 310-130 Repeat abdominal CAT scan was ordered which has revealed acute on chronic pancreatitis and pancreatic duct dilatation up to 6 mm.  Calcification was noticed which could be the etiology of the dilatation versus inflammatory changes narrowing the lumen of the distal pancreatic duct or chronic stricturing.  Tiny 4 mm hypodensity in the right hepatic lobe could represent a cyst or hemangioma   IV fluid and pain management continued.   Appreciated help by GI, as he had some stricturing pancreatic duct he need to follow a tertiary care center as an outpatient. He is followed at [facility] in the past. I advised to follow up at [facility] in next few weeks after discharge  * History of alcoholism   No signs of withdrawal, counseled to quit.  * Active smoker   Counseled to quit smoking for 4 minutes and offered nicotine patch.  * Leukocytosis   Most likely due to pancreatitis, coming down now.  All the records are reviewed and case discussed with  Care Management/Social Workerr. Management plans discussed with the patient, family and they are in agreement.  CODE STATUS: Full code.  TOTAL TIME TAKING CARE OF THIS PATIENT: 35 minutes.     POSSIBLE D/C IN 1-2 DAYS, DEPENDING ON CLINICAL CONDITION.   Nicholes Mango M.D on 10/15/2017   Between 7am to 6pm - Pager - [phone]  After 6pm go to [email or web address] - password [email or web address]  Office  [phone]  CC: Primary care physician; Patient, No Pcp Per  Note: This dictation was prepared with Dragon dictation along with smaller phrase technology. Any transcriptional errors that result from this process are unintentional.

## 2017-10-15 NOTE — Progress Notes (Signed)
[name], MD [facility]   [address] [place], [place] [address] Phone: [phone] Fax : [phone]   Subjective: The patient's lipase has come down but the patient reports that his pain is worse and at times he states it is even worse than when he was admitted. The patient also reports that he had increased pain with by mouth intake. The patient has acute on chronic pancreatitis on his previous CT scan.   Objective: Vital signs in last 24 hours: Vitals:   10/14/17 1547 10/14/17 1934 10/15/17 0512 10/15/17 0741  BP: 119/84 133/88 129/90 (!) 139/91  Pulse: 78 76 95 94  Resp:  18 18 16   Temp: 98.9 F (37.2 C) 98.4 F (36.9 C) 99 F (37.2 C) 99.2 F (37.3 C)  TempSrc: Oral Oral Oral Oral  SpO2: 95% 95% 91% 92%  Weight:      Height:       Weight change:   Intake/Output Summary (Last 24 hours) at 10/15/2017 1441 Last data filed at 10/15/2017 0900 Gross per 24 hour  Intake 3100 ml  Output -  Net 3100 ml     Exam: Heart:: Regular rate and rhythm, S1S2 present or without murmur or extra heart sounds Lungs: normal and clear to auscultation and percussion Abdomen: Epigastric tenderness without rebound without guarding   Lab Results: @LABTEST2 @ Micro Results: No results found for this or any previous visit (from the past 240 hour(s)). Studies/Results: Ct Abdomen Pelvis W Contrast  Result Date: 10/13/2017 CLINICAL DATA:  Epigastric pain x4 days. History of pancreatitis. Elevated lipase and leukocytosis. EXAM: CT ABDOMEN AND PELVIS WITH CONTRAST TECHNIQUE: Multidetector CT imaging of the abdomen and pelvis was performed using the standard protocol following bolus administration of intravenous contrast. CONTRAST:  141mL ISOVUE-300 IOPAMIDOL (ISOVUE-300) INJECTION 61% COMPARISON:  07/21/2016 FINDINGS: Lower chest: No acute abnormality. Hepatobiliary: No enhancing hepatic lesions. There is a 4 mm hypodensity in the right hepatic lobe too small to further characterize but  statistically consistent a cyst or hemangioma. This is not apparent on the prior comparison. No biliary dilatation. Normal appearing gallbladder without secondary signs of acute cholecystitis. Pancreas: Edematous appearance of the pancreatic head with peripancreatic edema consistent with acute pancreatitis. Scattered calcifications involving the pancreatic gland are consistent with stigmata of chronic pancreatitis. There is pancreatic ductal dilatation up to 6 mm involving the body and tail with smooth tapering within the head of the pancreas. On coronal series 5, image 34 there is a 2 mm calcification potentially within the distal pancreatic duct though this is not conclusive. Spleen: No splenomegaly or mass. Adrenals/Urinary Tract: Normal bilateral adrenal glands, kidneys and ureters. The urinary bladder is unremarkable. No obstructive uropathy is seen. Stomach/Bowel: Edematous inflamed appearing second and third portion of the duodenum with mucosal enhancement similar to prior study without definite perforation. Fluid attenuation within and what appears to be involving the medial wall of the second portion of the duodenum and anterior wall of the third could potentially be secondary to duodenal ulceration. The distal third and fourth portions are normal in caliber. The stomach is distended with fluid possibly size representing gastroparesis. No bowel obstruction. The appendix is surgically absent. The colon is unremarkable. Vascular/Lymphatic: Patent portal and splenic veins. Moderate aortic atherosclerosis without aneurysm. No lymphadenopathy. Reproductive: Prostate is unremarkable. Other: No abdominopelvic ascites or free air. Musculoskeletal: Chronic grade 1 anterolisthesis of L3 on L4. Associated mild disc space narrowing at L3-4 and L4-5, stable in appearance. No acute osseous appearing abnormality. IMPRESSION: 1. Edematous  appearance of the pancreatic head with peripancreatic inflammation consistent with  acute pancreatitis. This is superimposed on chronic pancreatitis given pancreatic gland calcifications. 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 The presence of mucosal ulcers within the second and proximal third portions of the duodenum accounting for fluid attenuation it is not excluded. 2. Pancreatic ductal dilatation up to 6 mm in caliber. There is a calcification that projects within the distal pancreatic duct measuring approximately 2 mm that could potentially be causing this dilatation versus inflammatory change narrowing the lumen of the distal pancreatic duct or chronic stricturing. 3. Probable gastroparesis secondary to adjacent pancreatic inflammation. 4. Tiny 4 mm hypodensity in the right hepatic lobe too small to further characterize but statistically consistent with a cyst or hemangioma. 5. Chronic stable degenerative disc disease L3-4 and L4-5 with grade 1 anterolisthesis of L3 on L4. Electronically Signed   By: Ashley Royalty M.D.   On: 10/13/2017 18:07   Medications: I have reviewed the patient's current medications. Scheduled Meds: . enoxaparin (LOVENOX) injection  40 mg Subcutaneous Q24H  . folic acid  1 mg Oral Daily  . multivitamin with minerals  1 tablet Oral Daily  . nicotine  14 mg Transdermal Daily  . pantoprazole (PROTONIX) IV  40 mg Intravenous QHS  . thiamine  100 mg Oral Daily   Or  . thiamine  100 mg Intravenous Daily   Continuous Infusions: . lactated ringers 100 mL/hr at 10/15/17 1349   PRN Meds:.acetaminophen **OR** acetaminophen, HYDROcodone-acetaminophen, ipratropium-albuterol, LORazepam **OR** LORazepam, morphine injection, ondansetron **OR** ondansetron (ZOFRAN) IV, polyethylene glycol   Assessment: Active Problems:   Pancreatitis    Plan: This patient came in with what appeared to be an incompetent pancreatitis and his lipase is common drastically down to 130. The patient's pain is not improving and in  fact she states that the pain is worse than it had been before. The patient will get a repeat CT scan of the abdomen to rule out any abscess or necrosis as the cause of increased pain. The patient has been explained the plan and agrees with it.   LOS: 2 days   [name] 10/15/2017, 2:41 PM

## 2017-10-15 NOTE — Progress Notes (Signed)
Pt attempting to drink first bottle of prep for CT. Pt does not think he can drink second bottle. MD Wohl notified. Will do CT without contrast if unable to drink.

## 2017-10-15 NOTE — Progress Notes (Signed)
Patients pain is only temporarily resolved with iv morphine for a couple hours.  Offered PO pain medication but patient refused.

## 2017-10-16 LAB — COMPREHENSIVE METABOLIC PANEL WITH GFR
ALT: 8 U/L — ABNORMAL LOW (ref 17–63)
AST: 14 U/L — ABNORMAL LOW (ref 15–41)
Albumin: 2.8 g/dL — ABNORMAL LOW (ref 3.5–5.0)
Alkaline Phosphatase: 64 U/L (ref 38–126)
Anion gap: 12 (ref 5–15)
BUN: 7 mg/dL (ref 6–20)
CO2: 24 mmol/L (ref 22–32)
Calcium: 8.2 mg/dL — ABNORMAL LOW (ref 8.9–10.3)
Chloride: 95 mmol/L — ABNORMAL LOW (ref 101–111)
Creatinine, Ser: 0.56 mg/dL — ABNORMAL LOW (ref 0.61–1.24)
GFR calc Af Amer: >60 mL/min (ref >60)
GFR calc non Af Amer: >60 mL/min (ref >60)
Glucose, Bld: 73 mg/dL (ref 65–99)
Potassium: 3.8 mmol/L (ref 3.5–5.1)
Sodium: 131 mmol/L — ABNORMAL LOW (ref 135–145)
Total Bilirubin: 1.1 mg/dL (ref 0.3–1.2)
Total Protein: 5.7 g/dL — ABNORMAL LOW (ref 6.5–8.1)

## 2017-10-16 LAB — CBC
HCT: 42.1 % (ref 40.0–52.0)
Hemoglobin: 14.3 g/dL (ref 13.0–18.0)
MCH: 31.9 pg (ref 26.0–34.0)
MCHC: 34 g/dL (ref 32.0–36.0)
MCV: 93.6 fL (ref 80.0–100.0)
PLATELETS: 158 10*3/uL (ref 150–440)
RBC: 4.49 MIL/uL (ref 4.40–5.90)
RDW: 13.1 % (ref 11.5–14.5)
WBC: 7.3 10*3/uL (ref 3.8–10.6)

## 2017-10-16 LAB — LIPASE, BLOOD: Lipase: 60 U/L — ABNORMAL HIGH (ref 11–51)

## 2017-10-16 MED ORDER — SENNOSIDES-DOCUSATE SODIUM 8.6-50 MG PO TABS
1.0000 | ORAL_TABLET | Freq: Two times a day (BID) | ORAL | Status: DC
  Administered 2017-10-16 – 2017-10-17 (×2): 1 via ORAL
  Filled 2017-10-16 (×2): qty 1

## 2017-10-16 NOTE — Progress Notes (Signed)
[name], MD [facility]   [address] [place], [place] [address] Phone: [phone] Fax : [phone]   Subjective: The patient continues to have abdominal pain despite his lower lipase. Due to the worsening of his pain and inability to take in food the patient had a repeat CT scan yesterday that showed similar appearance of the pancreas with acute on chronic pancreatitis and a soft tissue fullness in the head of pancreas with a probable obstructive pancreatic duct stone. There was also some thickening of the colonic wall thought to represent inflammation adjacent to the head of pancreas and duodenal inflammation. There was also a suspected variant of pancreatic divisum with a prominent dorsal duct entering the duodenum. The patient also now reports that he only took sips of alcohol despite reporting previously that he had cut back on drinking but had not completely stopped.   Objective: Vital signs in last 24 hours: Vitals:   10/15/17 0512 10/15/17 0741 10/15/17 2001 10/16/17 0410  BP: 129/90 (!) 139/91 139/87 135/85  Pulse: 95 94 90 86  Resp: 18 16 19 19   Temp: 99 F (37.2 C) 99.2 F (37.3 C) 98.6 F (37 C) 98.4 F (36.9 C)  TempSrc: Oral Oral Oral Oral  SpO2: 91% 92% 94% 95%  Weight:      Height:       Weight change:   Intake/Output Summary (Last 24 hours) at 10/16/2017 1348 Last data filed at 10/16/2017 0300 Gross per 24 hour  Intake 2440 ml  Output -  Net 2440 ml     Exam: Gen.: Patient laying in bed very still in no apparent distress Abdomen tender in the epigastric region   Lab Results: @LABTEST2 @ Micro Results: No results found for this or any previous visit (from the past 240 hour(s)). Studies/Results: Ct Abdomen W Contrast  Result Date: 10/16/2017 CLINICAL DATA:  Pancreatitis. Decreasing lipase with persistent pain. Evaluate for abscess or necrosis. EXAM: CT ABDOMEN WITH CONTRAST TECHNIQUE: Multidetector CT imaging of the abdomen was performed using  the standard protocol following bolus administration of intravenous contrast. CONTRAST:  27mL ISOVUE-300 IOPAMIDOL (ISOVUE-300) INJECTION 61% COMPARISON:  10/13/2017 and 07/21/2016. FINDINGS: Lower chest: Bibasilar atelectasis. Normal heart size. Trace bilateral pleural fluid, new. Hepatobiliary: A tiny right liver lobe low-density lesion is likely a cyst. Vicarious excretion of contrast by the gallbladder. Common duct dilatation at 11 mm on image 26/series 2, similar. Pancreas: Mild pancreatic duct dilatation throughout the body and tail. The duct is similarly dilated at 6 mm in the region of the neck. This continues to the level of the pancreatic head, where there is similar soft tissue fullness, including on image 30/series 2. Suspect obstructive pancreatic duct stone of 3 mm within the head/ neck junction, including on coronal image 31. Moderate surrounding peripancreatic and periduodenal inflammation is similar to on the most recent exam. Prominent dorsal duct entering the duodenum, including on image 31/series 2. No developing peripancreatic fluid collection or evidence of pancreatic necrosis. There are pancreatic calcifications within the area of pancreatic head soft tissue fullness. Spleen: Normal in size, without focal abnormality. Adrenals/Urinary Tract: Normal adrenal glands. Normal kidneys, without hydronephrosis. Stomach/Bowel: Normal stomach. Fluid-filled colon, suggesting a diarrheal state. The hepatic flexure of the colon is mildly thick walled, including on image 34/series 2. Duodenal C-loop inflammation, similar. The remainder of the small bowel is unremarkable. Vascular/Lymphatic: Age advanced aortic and branch vessel atherosclerosis. Although the portal, splenic, and superior mesenteric veins are all patent, there collateral veins within the small  bowel mesentery which suggest a component of insufficiency. Small retroperitoneal and periportal nodes, not pathologic by size criteria. Other: Trace  right pericolic gutter ascites. No free intraperitoneal air. Musculoskeletal: Posterolateral left ninth rib remote trauma. Lumbosacral spondylosis which is advanced. IMPRESSION: 1. Similar appearance of the pancreas since 10/13/2017. Findings which are at least partially felt to be due to acute or chronic pancreatitis, with soft tissue fullness in the pancreatic head, surrounding inflammation, and a probable obstructive pancreatic duct stone. 2. Suspicion of hepatic flexure colonic wall thickening, likely due to colitis. This may be due to is direct extension from the adjacent pancreatic head and duodenal inflammation. 3. Mild biliary duct dilatation, similar. Given the soft tissue fullness in the pancreatic head (most likely due to chronic pancreatitis), an underlying neoplasm cannot be excluded. When the patient is clinically improved from the acute pancreatitis episode, potential clinical strategies include outpatient dedicated pre and post contrast abdominal MRI/MRCP versus endoscopic ultrasound. 4.  Aortic Atherosclerosis (ICD10-I70.0). 5. Suspect a variant of pancreas divisum, with a prominent dorsal duct entering the duodenum. Electronically Signed   By: Abigail Miyamoto M.D.   On: 10/16/2017 07:35   Medications: I have reviewed the patient's current medications. Scheduled Meds: . enoxaparin (LOVENOX) injection  40 mg Subcutaneous Q24H  . folic acid  1 mg Oral Daily  . multivitamin with minerals  1 tablet Oral Daily  . nicotine  14 mg Transdermal Daily  . pantoprazole (PROTONIX) IV  40 mg Intravenous QHS  . thiamine  100 mg Oral Daily   Or  . thiamine  100 mg Intravenous Daily   Continuous Infusions: . lactated ringers 100 mL/hr at 10/15/17 1349   PRN Meds:.acetaminophen **OR** acetaminophen, HYDROcodone-acetaminophen, ipratropium-albuterol, LORazepam **OR** LORazepam, morphine injection, ondansetron **OR** ondansetron (ZOFRAN) IV, polyethylene glycol   Assessment: Active Problems:    Pancreatitis    Plan: This patient was admitted with acute on chronic pancreatitis and has suspicion for a probable obstructing pancreatic duct stone despite his lipase improving. The patient continues to have worsening of his abdominal pain and  is not able to tolerate by mouth's. If the patient continues to not improve with his pain then the patient may need to be transferred to [facility] for a ERCP with pancreatic stone extraction.   LOS: 3 days   Amberleigh Gerken 10/16/2017, 1:48 PM

## 2017-10-16 NOTE — Care Management Note (Signed)
Case Management Note  Patient Details  Name: Jordan Horton MRN: [id number] Date of Birth: [birth year]  Subjective/Objective:   Met with patient at bedside to discuss lack of insurance. Application given for medication management and [facility]. Email sent to both agencies. Patient is not active with [facility] clinics.                  Action/Plan:   Expected Discharge Date:                  Expected Discharge Plan:     In-House Referral:     Discharge planning Services  CM Consult, [facility], Medication Assistance  Post Acute Care Choice:    Choice offered to:     DME Arranged:    DME Agency:     HH Arranged:    HH Agency:     Status of Service:  Completed, signed off  If discussed at [employer], dates discussed:    Additional Comments:  Jolly Mango, RN 10/16/2017, 9:05 AM

## 2017-10-16 NOTE — Progress Notes (Signed)
[facility] [place] NAME: Jordan Horton    MR#:  [id number]  DATE OF BIRTH:  07/26/1961  SUBJECTIVE:  CHIEF COMPLAINT:   Chief Complaint  Patient presents with  . Abdominal Pain  . Emesis  . Constipation    Ch pancreatitis, came after alcohol use and pain again.   Could not tolerate soft/liquid diet.  Reporting 9 out of 10 abdominal pain.  REVIEW OF SYSTEMS:  CONSTITUTIONAL: No fever, fatigue or weakness.  EYES: No blurred or double vision.  EARS, NOSE, AND THROAT: No tinnitus or ear pain.  RESPIRATORY: No cough, shortness of breath, wheezing or hemoptysis.  CARDIOVASCULAR: No chest pain, orthopnea, edema.  GASTROINTESTINAL: No nausea, vomiting, diarrhea.  Reporting severe epigastric abdominal pain.  GENITOURINARY: No dysuria, hematuria.  ENDOCRINE: No polyuria, nocturia,  HEMATOLOGY: No anemia, easy bruising or bleeding SKIN: No rash or lesion. MUSCULOSKELETAL: No joint pain or arthritis.   NEUROLOGIC: No tingling, numbness, weakness.  PSYCHIATRY: No anxiety or depression.   ROS  DRUG ALLERGIES:  No Known Allergies  VITALS:  Blood pressure 135/85, pulse 86, temperature 98.4 F (36.9 C), temperature source Oral, resp. rate 19, height 5\' 5"  (1.651 m), weight 58 kg (127 lb 12.8 oz), SpO2 95 %.  PHYSICAL EXAMINATION:  GENERAL:  57 y.o.-year-old patient lying in the bed with no acute distress.  EYES: Pupils equal, round, reactive to light and accommodation. No scleral icterus. Extraocular muscles intact.  HEENT: Head atraumatic, normocephalic. Oropharynx and nasopharynx clear.  NECK:  Supple, no jugular venous distention. No thyroid enlargement, no tenderness.  LUNGS: Normal breath sounds bilaterally, no wheezing, rales,rhonchi or crepitation. No use of accessory muscles of respiration.  CARDIOVASCULAR: S1, S2 normal. No murmurs, rubs, or gallops.  ABDOMEN: Soft, tender, nondistended. Bowel sounds present.  No rebound  tenderness no organomegaly or mass.  EXTREMITIES: No pedal edema, cyanosis, or clubbing.  NEUROLOGIC: Cranial nerves II through XII are intact. Muscle strength 5/5 in all extremities. Sensation intact. Gait not checked.  PSYCHIATRIC: The patient is alert and oriented x 3.  SKIN: No obvious rash, lesion, or ulcer.   Physical Exam LABORATORY PANEL:   CBC Recent Labs  Lab 10/16/17 0430  WBC 7.3  HGB 14.3  HCT 42.1  PLT 158   ------------------------------------------------------------------------------------------------------------------  Chemistries  Recent Labs  Lab 10/16/17 0430  NA 131*  K 3.8  CL 95*  CO2 24  GLUCOSE 73  BUN 7  CREATININE 0.56*  CALCIUM 8.2*  AST 14*  ALT 8*  ALKPHOS 64  BILITOT 1.1   ------------------------------------------------------------------------------------------------------------------  Cardiac Enzymes Recent Labs  Lab 10/13/17 1515 10/13/17 1913  TROPONINI 0.03* <0.03   ------------------------------------------------------------------------------------------------------------------  RADIOLOGY:  Ct Abdomen W Contrast  Result Date: 10/16/2017 CLINICAL DATA:  Pancreatitis. Decreasing lipase with persistent pain. Evaluate for abscess or necrosis. EXAM: CT ABDOMEN WITH CONTRAST TECHNIQUE: Multidetector CT imaging of the abdomen was performed using the standard protocol following bolus administration of intravenous contrast. CONTRAST:  54mL ISOVUE-300 IOPAMIDOL (ISOVUE-300) INJECTION 61% COMPARISON:  10/13/2017 and 07/21/2016. FINDINGS: Lower chest: Bibasilar atelectasis. Normal heart size. Trace bilateral pleural fluid, new. Hepatobiliary: A tiny right liver lobe low-density lesion is likely a cyst. Vicarious excretion of contrast by the gallbladder. Common duct dilatation at 11 mm on image 26/series 2, similar. Pancreas: Mild pancreatic duct dilatation throughout the body and tail. The duct is similarly dilated at 6 mm in the region of  the neck. This continues to the level of the pancreatic head,  where there is similar soft tissue fullness, including on image 30/series 2. Suspect obstructive pancreatic duct stone of 3 mm within the head/ neck junction, including on coronal image 31. Moderate surrounding peripancreatic and periduodenal inflammation is similar to on the most recent exam. Prominent dorsal duct entering the duodenum, including on image 31/series 2. No developing peripancreatic fluid collection or evidence of pancreatic necrosis. There are pancreatic calcifications within the area of pancreatic head soft tissue fullness. Spleen: Normal in size, without focal abnormality. Adrenals/Urinary Tract: Normal adrenal glands. Normal kidneys, without hydronephrosis. Stomach/Bowel: Normal stomach. Fluid-filled colon, suggesting a diarrheal state. The hepatic flexure of the colon is mildly thick walled, including on image 34/series 2. Duodenal C-loop inflammation, similar. The remainder of the small bowel is unremarkable. Vascular/Lymphatic: Age advanced aortic and branch vessel atherosclerosis. Although the portal, splenic, and superior mesenteric veins are all patent, there collateral veins within the small bowel mesentery which suggest a component of insufficiency. Small retroperitoneal and periportal nodes, not pathologic by size criteria. Other: Trace right pericolic gutter ascites. No free intraperitoneal air. Musculoskeletal: Posterolateral left ninth rib remote trauma. Lumbosacral spondylosis which is advanced. IMPRESSION: 1. Similar appearance of the pancreas since 10/13/2017. Findings which are at least partially felt to be due to acute or chronic pancreatitis, with soft tissue fullness in the pancreatic head, surrounding inflammation, and a probable obstructive pancreatic duct stone. 2. Suspicion of hepatic flexure colonic wall thickening, likely due to colitis. This may be due to is direct extension from the adjacent pancreatic head  and duodenal inflammation. 3. Mild biliary duct dilatation, similar. Given the soft tissue fullness in the pancreatic head (most likely due to chronic pancreatitis), an underlying neoplasm cannot be excluded. When the patient is clinically improved from the acute pancreatitis episode, potential clinical strategies include outpatient dedicated pre and post contrast abdominal MRI/MRCP versus endoscopic ultrasound. 4.  Aortic Atherosclerosis (ICD10-I70.0). 5. Suspect a variant of pancreas divisum, with a prominent dorsal duct entering the duodenum. Electronically Signed   By: Abigail Miyamoto M.D.   On: 10/16/2017 07:35    ASSESSMENT AND PLAN:   Active Problems:   Pancreatitis  * Acute pancreatitis on chronic pancreatitis and has suspicion for a probable obstructing pancreatic duct stone /pancreatic divisum  npo Lipase is 310-130-60 lipase is improving Reports continues abdominal pain and could not tolerate p.o. Repeat abdominal CAT scan was ordered which has revealed acute on chronic pancreatitis and pancreatic duct dilatation up to 6 mm.  Calcification was noticed which could be the etiology of the dilatation versus inflammatory changes narrowing the lumen of the distal pancreatic duct or chronic stricturing.  Tiny 4 mm hypodensity in the right hepatic lobe could represent a cyst or hemangioma   IV fluid and pain management continued.  If the patient continues to not improve with his pain then the patient may need to be transferred to [facility] for a ERCP with pancreatic stone extraction as per GI     * History of alcoholism   No signs of withdrawal, counseled to quit.  * Active smoker   Counseled to quit smoking for 4 minutes and offered nicotine patch.  * Leukocytosis   Most likely due to pancreatitis, coming down now.  Pt adamantly refusing PT     All the records are reviewed and case discussed with Care Management/Social Workerr. Management plans discussed with the patient, family  and they are in agreement.  CODE STATUS: Full code.  TOTAL TIME TAKING CARE OF THIS PATIENT: 35 minutes.  POSSIBLE D/C IN 1-2 DAYS, DEPENDING ON CLINICAL CONDITION.   Jordan Horton M.D on 10/16/2017   Between 7am to 6pm - Pager - [phone]  After 6pm go to [email or web address] - password [email or web address] [facility]  Office  [phone]  CC: Primary care physician; Patient, No Pcp Per  Note: This dictation was prepared with Dragon dictation along with smaller phrase technology. Any transcriptional errors that result from this process are unintentional.

## 2017-10-16 NOTE — Progress Notes (Signed)
PT Cancellation Note  Patient Details Name: Jordan Horton MRN: [id number] DOB: 1961/09/27   Cancelled Treatment:    Reason Eval/Treat Not Completed: Patient declined, no reason specified.  PT consult received.  Chart reviewed.  Pt adamantly refusing PT stating "I don't need no Physical Therapy".  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  Will discontinue PT order d/t this.  Nursing notified.  MD Gouru notified.  Leitha Bleak, PT 10/16/17, 9:25 AM [phone]

## 2017-10-17 LAB — LIPASE, BLOOD: Lipase: 49 U/L (ref 11–51)

## 2017-10-17 MED ORDER — THIAMINE HCL 100 MG PO TABS: 100.0000 mg | ORAL_TABLET | Freq: Every day | ORAL | Status: AC

## 2017-10-17 MED ORDER — HYDROCODONE-ACETAMINOPHEN 5-325 MG PO TABS: 1.0000 | ORAL_TABLET | Freq: Four times a day (QID) | ORAL | 0 refills | Status: AC | PRN

## 2017-10-17 MED ORDER — NICOTINE 14 MG/24HR TD PT24: 14.0000 mg | MEDICATED_PATCH | Freq: Every day | TRANSDERMAL | 0 refills | Status: AC

## 2017-10-17 MED ORDER — ADULT MULTIVITAMIN W/MINERALS CH: 1.0000 | ORAL_TABLET | Freq: Every day | ORAL | Status: AC

## 2017-10-17 MED ORDER — FOLIC ACID 1 MG PO TABS: 1.0000 mg | ORAL_TABLET | Freq: Every day | ORAL | Status: AC

## 2017-10-17 NOTE — Progress Notes (Signed)
Patient being discharged to home. Wife bedside. DC, follow-up and RX instructions given and patient acknowledged understanding. IV removed.

## 2017-10-17 NOTE — Discharge Summary (Signed)
[facility] NAME: Jordan Horton    MR#:  [id number]  DATE OF BIRTH:  January 11, 1961  DATE OF ADMISSION:  10/13/2017 ADMITTING PHYSICIAN: Gorden Harms, MD  DATE OF DISCHARGE:  10/17/2017  PRIMARY CARE PHYSICIAN: Patient, No Pcp Per    ADMISSION DIAGNOSIS:  Other acute pancreatitis without infection or necrosis [K85.80]  DISCHARGE DIAGNOSIS:  Active Problems:   Pancreatitis   SECONDARY DIAGNOSIS:   Past Medical History:  Diagnosis Date  . Arthritis    hands  . 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 Pancreatitis     HOSPITAL COURSE:   HISTORY OF PRESENT ILLNESS: Jordan Horton  is a 57 y.o. male with a known history of pancreatitis secondary to alcoholism presents the emergency room with 4-day history of worsening recurrent abdominal pain radiating into the back associated with decreased p.o. intake and nausea, ER workup noted for acute pancreatitis on CT of the abdomen, white count 14,000, white lipase greater than 2000, patient evaluated in the emergency room, resting comfortably in bed after IV pain medication.  Patient states that he last drank alcohol couple weeks ago, continues to smoke, patient now being admitted for acute on chronic pancreatitis.   * Acute pancreatitis on chronic pancreatitis and has suspicion for a probable obstructing pancreatic duct stone /pancreatic divisum  Patient clinically improved significantly with no abdominal pain today Tolerated clear liquid diet and advance to liquid diet and soft diet and wants to go home Okay to discharge patient from GI standpoint Lipase is [phone] [phone]  lipase is improving Repeat abdominal CAT scan was ordered which has revealed acute on chronic pancreatitis and pancreatic duct dilatation up to 6 mm.  Calcification was noticed which could be the etiology of the dilatation versus inflammatory changes narrowing the lumen of the distal pancreatic duct or chronic  stricturing.  Tiny 4 mm hypodensity in the right hepatic lobe could represent a cyst or hemangioma   IV fluid and pain management continued during the hospital course  Patient was recommended to pay follow-up with [facility] or [place] gastroenterology for acute on chronic pancreatitis  * History of alcoholism   No signs of withdrawal, counseled to quit.  * Active smoker   Counseled to quit smoking for 4 minutes and offered nicotine patch.  * Leukocytosis   Most likely due to pancreatitis, coming down now.  Pt adamantly refusing PT       DISCHARGE CONDITIONS:   Stable   CONSULTS OBTAINED:  Treatment Team:  [name], MD   PROCEDURES  None   DRUG ALLERGIES:  No Known Allergies  DISCHARGE MEDICATIONS:   Allergies as of 10/17/2017   No Known Allergies     Medication List    TAKE these medications   folic acid 1 MG tablet Commonly known as:  FOLVITE Take 1 tablet (1 mg total) by mouth daily. Start taking on:  10/18/2017   HYDROcodone-acetaminophen 5-325 MG tablet Commonly known as:  NORCO/VICODIN Take 1-2 tablets by mouth every 6 (six) hours as needed for moderate pain.   multivitamin with minerals Tabs tablet Take 1 tablet by mouth daily. Start taking on:  10/18/2017   nicotine 14 mg/24hr patch Commonly known as:  NICODERM CQ - dosed in mg/24 hours Place 1 patch (14 mg total) onto the skin daily. Start taking on:  10/18/2017   thiamine 100 MG tablet Take 1 tablet (100 mg total) by mouth daily. Start taking on:  10/18/2017  DISCHARGE INSTRUCTIONS:   Follow-up with primary care physician in a week Follow-up with [facility] or [place] gastroenterology and pancreatitis specialist in 1-2 weeks   DIET:  Low fat, Low cholesterol diet  DISCHARGE CONDITION:  Fair  ACTIVITY:  Activity as tolerated  OXYGEN:  Home Oxygen: No.   Oxygen Delivery: room air  DISCHARGE LOCATION:  home   If you experience worsening of your admission symptoms, develop shortness  of breath, life threatening emergency, suicidal or homicidal thoughts you must seek medical attention immediately by calling 911 or calling your MD immediately  if symptoms less severe.  You Must read complete instructions/literature along with all the possible adverse reactions/side effects for all the Medicines you take and that have been prescribed to you. Take any new Medicines after you have completely understood and accpet all the possible adverse reactions/side effects.   Please note  You were cared for by a hospitalist during your hospital stay. If you have any questions about your discharge medications or the care you received while you were in the hospital after you are discharged, you can call the unit and asked to speak with the hospitalist on call if the hospitalist that took care of you is not available. Once you are discharged, your primary care physician will handle any further medical issues. Please note that NO REFILLS for any discharge medications will be authorized once you are discharged, as it is imperative that you return to your primary care physician (or establish a relationship with a primary care physician if you do not have one) for your aftercare needs so that they can reassess your need for medications and monitor your lab values.     Today  Chief Complaint  Patient presents with  . Abdominal Pain  . Emesis  . Constipation   Pt is feeling much better.  No abdominal pain.  Tolerated advance diet and wants to go home  ROS:  CONSTITUTIONAL: Denies fevers, chills. Denies any fatigue, weakness.  EYES: Denies blurry vision, double vision, eye pain. EARS, NOSE, THROAT: Denies tinnitus, ear pain, hearing loss. RESPIRATORY: Denies cough, wheeze, shortness of breath.  CARDIOVASCULAR: Denies chest pain, palpitations, edema.  GASTROINTESTINAL: Denies nausea, vomiting, diarrhea, abdominal pain. Denies bright red blood per rectum. GENITOURINARY: Denies dysuria,  hematuria. ENDOCRINE: Denies nocturia or thyroid problems. HEMATOLOGIC AND LYMPHATIC: Denies easy bruising or bleeding. SKIN: Denies rash or lesion. MUSCULOSKELETAL: Denies pain in neck, back, shoulder, knees, hips or arthritic symptoms.  NEUROLOGIC: Denies paralysis, paresthesias.  PSYCHIATRIC: Denies anxiety or depressive symptoms.   VITAL SIGNS:  Blood pressure (!) 155/95, pulse 89, temperature 98.7 F (37.1 C), temperature source Oral, resp. rate 18, height 5\' 5"  (1.651 m), weight 58 kg (127 lb 12.8 oz), SpO2 94 %.  I/O:    Intake/Output Summary (Last 24 hours) at 10/17/2017 1224 Last data filed at 10/17/2017 1000 Gross per 24 hour  Intake 3300 ml  Output -  Net 3300 ml    PHYSICAL EXAMINATION:  GENERAL:  57 y.o.-year-old patient lying in the bed with no acute distress.  EYES: Pupils equal, round, reactive to light and accommodation. No scleral icterus. Extraocular muscles intact.  HEENT: Head atraumatic, normocephalic. Oropharynx and nasopharynx clear.  NECK:  Supple, no jugular venous distention. No thyroid enlargement, no tenderness.  LUNGS: Normal breath sounds bilaterally, no wheezing, rales,rhonchi or crepitation. No use of accessory muscles of respiration.  CARDIOVASCULAR: S1, S2 normal. No murmurs, rubs, or gallops.  ABDOMEN: Soft, non-tender, non-distended. Bowel sounds present. No organomegaly or  mass.  EXTREMITIES: No pedal edema, cyanosis, or clubbing.  NEUROLOGIC: Cranial nerves II through XII are intact. Muscle strength 5/5 in all extremities. Sensation intact. Gait not checked.  PSYCHIATRIC: The patient is alert and oriented x 3.  SKIN: No obvious rash, lesion, or ulcer.   DATA REVIEW:   CBC Recent Labs  Lab 10/16/17 0430  WBC 7.3  HGB 14.3  HCT 42.1  PLT 158    Chemistries  Recent Labs  Lab 10/16/17 0430  NA 131*  K 3.8  CL 95*  CO2 24  GLUCOSE 73  BUN 7  CREATININE 0.56*  CALCIUM 8.2*  AST 14*  ALT 8*  ALKPHOS 64  BILITOT 1.1     Cardiac Enzymes Recent Labs  Lab 10/13/17 1913  TROPONINI <0.03    Microbiology Results  No results found for this or any previous visit.  RADIOLOGY:  Ct Abdomen W Contrast  Result Date: 10/16/2017 CLINICAL DATA:  Pancreatitis. Decreasing lipase with persistent pain. Evaluate for abscess or necrosis. EXAM: CT ABDOMEN WITH CONTRAST TECHNIQUE: Multidetector CT imaging of the abdomen was performed using the standard protocol following bolus administration of intravenous contrast. CONTRAST:  53mL ISOVUE-300 IOPAMIDOL (ISOVUE-300) INJECTION 61% COMPARISON:  10/13/2017 and 07/21/2016. FINDINGS: Lower chest: Bibasilar atelectasis. Normal heart size. Trace bilateral pleural fluid, new. Hepatobiliary: A tiny right liver lobe low-density lesion is likely a cyst. Vicarious excretion of contrast by the gallbladder. Common duct dilatation at 11 mm on image 26/series 2, similar. Pancreas: Mild pancreatic duct dilatation throughout the body and tail. The duct is similarly dilated at 6 mm in the region of the neck. This continues to the level of the pancreatic head, where there is similar soft tissue fullness, including on image 30/series 2. Suspect obstructive pancreatic duct stone of 3 mm within the head/ neck junction, including on coronal image 31. Moderate surrounding peripancreatic and periduodenal inflammation is similar to on the most recent exam. Prominent dorsal duct entering the duodenum, including on image 31/series 2. No developing peripancreatic fluid collection or evidence of pancreatic necrosis. There are pancreatic calcifications within the area of pancreatic head soft tissue fullness. Spleen: Normal in size, without focal abnormality. Adrenals/Urinary Tract: Normal adrenal glands. Normal kidneys, without hydronephrosis. Stomach/Bowel: Normal stomach. Fluid-filled colon, suggesting a diarrheal state. The hepatic flexure of the colon is mildly thick walled, including on image 34/series 2. Duodenal  C-loop inflammation, similar. The remainder of the small bowel is unremarkable. Vascular/Lymphatic: Age advanced aortic and branch vessel atherosclerosis. Although the portal, splenic, and superior mesenteric veins are all patent, there collateral veins within the small bowel mesentery which suggest a component of insufficiency. Small retroperitoneal and periportal nodes, not pathologic by size criteria. Other: Trace right pericolic gutter ascites. No free intraperitoneal air. Musculoskeletal: Posterolateral left ninth rib remote trauma. Lumbosacral spondylosis which is advanced. IMPRESSION: 1. Similar appearance of the pancreas since 10/13/2017. Findings which are at least partially felt to be due to acute or chronic pancreatitis, with soft tissue fullness in the pancreatic head, surrounding inflammation, and a probable obstructive pancreatic duct stone. 2. Suspicion of hepatic flexure colonic wall thickening, likely due to colitis. This may be due to is direct extension from the adjacent pancreatic head and duodenal inflammation. 3. Mild biliary duct dilatation, similar. Given the soft tissue fullness in the pancreatic head (most likely due to chronic pancreatitis), an underlying neoplasm cannot be excluded. When the patient is clinically improved from the acute pancreatitis episode, potential clinical strategies include outpatient dedicated pre and post contrast  abdominal MRI/MRCP versus endoscopic ultrasound. 4.  Aortic Atherosclerosis (ICD10-I70.0). 5. Suspect a variant of pancreas divisum, with a prominent dorsal duct entering the duodenum. Electronically Signed   By: Abigail Miyamoto M.D.   On: 10/16/2017 07:35   Ct Abdomen Pelvis W Contrast  Result Date: 10/13/2017 CLINICAL DATA:  Epigastric pain x4 days. History of pancreatitis. Elevated lipase and leukocytosis. EXAM: CT ABDOMEN AND PELVIS WITH CONTRAST TECHNIQUE: Multidetector CT imaging of the abdomen and pelvis was performed using the standard protocol  following bolus administration of intravenous contrast. CONTRAST:  134mL ISOVUE-300 IOPAMIDOL (ISOVUE-300) INJECTION 61% COMPARISON:  07/21/2016 FINDINGS: Lower chest: No acute abnormality. Hepatobiliary: No enhancing hepatic lesions. There is a 4 mm hypodensity in the right hepatic lobe too small to further characterize but statistically consistent a cyst or hemangioma. This is not apparent on the prior comparison. No biliary dilatation. Normal appearing gallbladder without secondary signs of acute cholecystitis. Pancreas: Edematous appearance of the pancreatic head with peripancreatic edema consistent with acute pancreatitis. Scattered calcifications involving the pancreatic gland are consistent with stigmata of chronic pancreatitis. There is pancreatic ductal dilatation up to 6 mm involving the body and tail with smooth tapering within the head of the pancreas. On coronal series 5, image 34 there is a 2 mm calcification potentially within the distal pancreatic duct though this is not conclusive. Spleen: No splenomegaly or mass. Adrenals/Urinary Tract: Normal bilateral adrenal glands, kidneys and ureters. The urinary bladder is unremarkable. No obstructive uropathy is seen. Stomach/Bowel: Edematous inflamed appearing second and third portion of the duodenum with mucosal enhancement similar to prior study without definite perforation. Fluid attenuation within and what appears to be involving the medial wall of the second portion of the duodenum and anterior wall of the third could potentially be secondary to duodenal ulceration. The distal third and fourth portions are normal in caliber. The stomach is distended with fluid possibly size representing gastroparesis. No bowel obstruction. The appendix is surgically absent. The colon is unremarkable. Vascular/Lymphatic: Patent portal and splenic veins. Moderate aortic atherosclerosis without aneurysm. No lymphadenopathy. Reproductive: Prostate is unremarkable. Other:  No abdominopelvic ascites or free air. Musculoskeletal: Chronic grade 1 anterolisthesis of L3 on L4. Associated mild disc space narrowing at L3-4 and L4-5, stable in appearance. No acute osseous appearing abnormality. IMPRESSION: 1. Edematous appearance of the pancreatic head with peripancreatic inflammation consistent with acute pancreatitis. This is superimposed on chronic pancreatitis given pancreatic gland calcifications. 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 The presence of mucosal ulcers within the second and proximal third portions of the duodenum accounting for fluid attenuation it is not excluded. 2. Pancreatic ductal dilatation up to 6 mm in caliber. There is a calcification that projects within the distal pancreatic duct measuring approximately 2 mm that could potentially be causing this dilatation versus inflammatory change narrowing the lumen of the distal pancreatic duct or chronic stricturing. 3. Probable gastroparesis secondary to adjacent pancreatic inflammation. 4. Tiny 4 mm hypodensity in the right hepatic lobe too small to further characterize but statistically consistent with a cyst or hemangioma. 5. Chronic stable degenerative disc disease L3-4 and L4-5 with grade 1 anterolisthesis of L3 on L4. Electronically Signed   By: Ashley Royalty M.D.   On: 10/13/2017 18:07    EKG:   Orders placed or performed during the hospital encounter of 10/13/17  . ED EKG  . ED EKG      Management plans discussed with the patient, family and they are in  agreement.  CODE STATUS:     Code Status Orders  (From admission, onward)        Start     Ordered   10/13/17 2028  Full code  Continuous     10/13/17 2027    Code Status History    Date Active Date Inactive Code Status Order ID Comments User Context   07/21/2016 20:53 07/24/2016 14:50 Full Code [id number]  Hugelmeyer, Ubaldo Glassing, DO Inpatient      TOTAL TIME TAKING CARE OF THIS PATIENT:  43  minutes.   Note: This dictation was prepared with Dragon dictation along with smaller phrase technology. Any transcriptional errors that result from this process are unintentional.   @[facility] @  on 10/17/2017 at 12:24 PM  Between 7am to 6pm - Pager - [phone]  After 6pm go to [email or web address] - password [email or web address] [facility]  Office  [phone]  CC: Primary care physician; Patient, No Pcp Per

## 2017-10-17 NOTE — Progress Notes (Signed)
Paged Dr. Margaretmary Eddy to advise her that the order reconciliation was not complete on patients discharge. Patient up dressed, walking around unit, waiting for d/c.

## 2017-10-17 NOTE — Progress Notes (Signed)
Patient very agitated this morning and states that "if he doesn't get anything to eat he will leave" AMA. He states "no pain except hunger pain".  Sent text to Dr. Margaretmary Eddy to adviser her of patients agitation.

## 2017-10-17 NOTE — Discharge Instructions (Signed)
Follow-up with primary care physician in a week Follow-up with [facility] or [place] gastroenterology and pancreatitis specialist in 1-2 weeks

## 2020-02-10 ENCOUNTER — Emergency Department: Payer: Self-pay

## 2020-02-10 ENCOUNTER — Other Ambulatory Visit: Payer: Self-pay

## 2020-02-10 ENCOUNTER — Encounter: Payer: Self-pay | Admitting: *Deleted

## 2020-02-10 ENCOUNTER — Emergency Department
Admission: EM | Admit: 2020-02-10 | Discharge: 2020-02-10 | Disposition: A | Discharge status: Home / Self Care | Payer: Self-pay | Attending: Emergency Medicine | Admitting: Emergency Medicine

## 2020-02-10 DIAGNOSIS — Z5321 Procedure and treatment not carried out due to patient leaving prior to being seen by health care provider: Secondary | ICD-10-CM | POA: Insufficient documentation

## 2020-02-10 DIAGNOSIS — R079 Chest pain, unspecified: Secondary | ICD-10-CM | POA: Insufficient documentation

## 2020-02-10 LAB — BASIC METABOLIC PANEL
Anion gap: 7 (ref 5–15)
BUN: 11 mg/dL (ref 6–20)
CO2: 27 mmol/L (ref 22–32)
Calcium: 8.8 mg/dL — ABNORMAL LOW (ref 8.9–10.3)
Chloride: 104 mmol/L (ref 98–111)
Creatinine, Ser: 0.57 mg/dL — ABNORMAL LOW (ref 0.61–1.24)
GFR calc Af Amer: >60 mL/min (ref >60)
GFR calc non Af Amer: >60 mL/min (ref >60)
Glucose, Bld: 89 mg/dL (ref 70–99)
Potassium: 3.7 mmol/L (ref 3.5–5.1)
Sodium: 138 mmol/L (ref 135–145)

## 2020-02-10 LAB — CBC
HCT: 42.3 % (ref 39.0–52.0)
Hemoglobin: 14.8 g/dL (ref 13.0–17.0)
MCH: 31.5 pg (ref 26.0–34.0)
MCHC: 35 g/dL (ref 30.0–36.0)
MCV: 90 fL (ref 80.0–100.0)
Platelets: 190 10*3/uL (ref 150–400)
RBC: 4.7 MIL/uL (ref 4.22–5.81)
RDW: 12.7 % (ref 11.5–15.5)
WBC: 9.2 10*3/uL (ref 4.0–10.5)
nRBC: 0 % (ref 0.0–0.2)

## 2020-02-10 LAB — TROPONIN I (HIGH SENSITIVITY)
Troponin I (High Sensitivity): 6 ng/L (ref <18)
Troponin I (High Sensitivity): 6 ng/L (ref <18)

## 2020-02-10 MED ORDER — SODIUM CHLORIDE 0.9% FLUSH: 3.0000 mL | Freq: Once | INTRAVENOUS | Status: DC

## 2020-02-10 NOTE — ED Triage Notes (Signed)
Chest pain x 2 days, pain worse today. Pt c/o substernal CP, reproducible w/ palpation. Pt c/o numbness radiating to bilateral arms intermittently. Pt in no acute respiratory distress at this time. Pt state his "wife made me" come to the ED today. Pt also c/o "really bad" gas x 2 days. Pt able to take PO, denies n/v/d.

## 2020-02-10 NOTE — ED Notes (Signed)
Called for in all waiting rooms, no one answered.

## 2022-01-17 IMAGING — CR DG CHEST 2V
2 series · 2 of 2 positions shown · non-contrast
Comparison: None.

CLINICAL DATA: Chest pain for 2 days

EXAM:
CHEST - 2 VIEW

[chest pa]
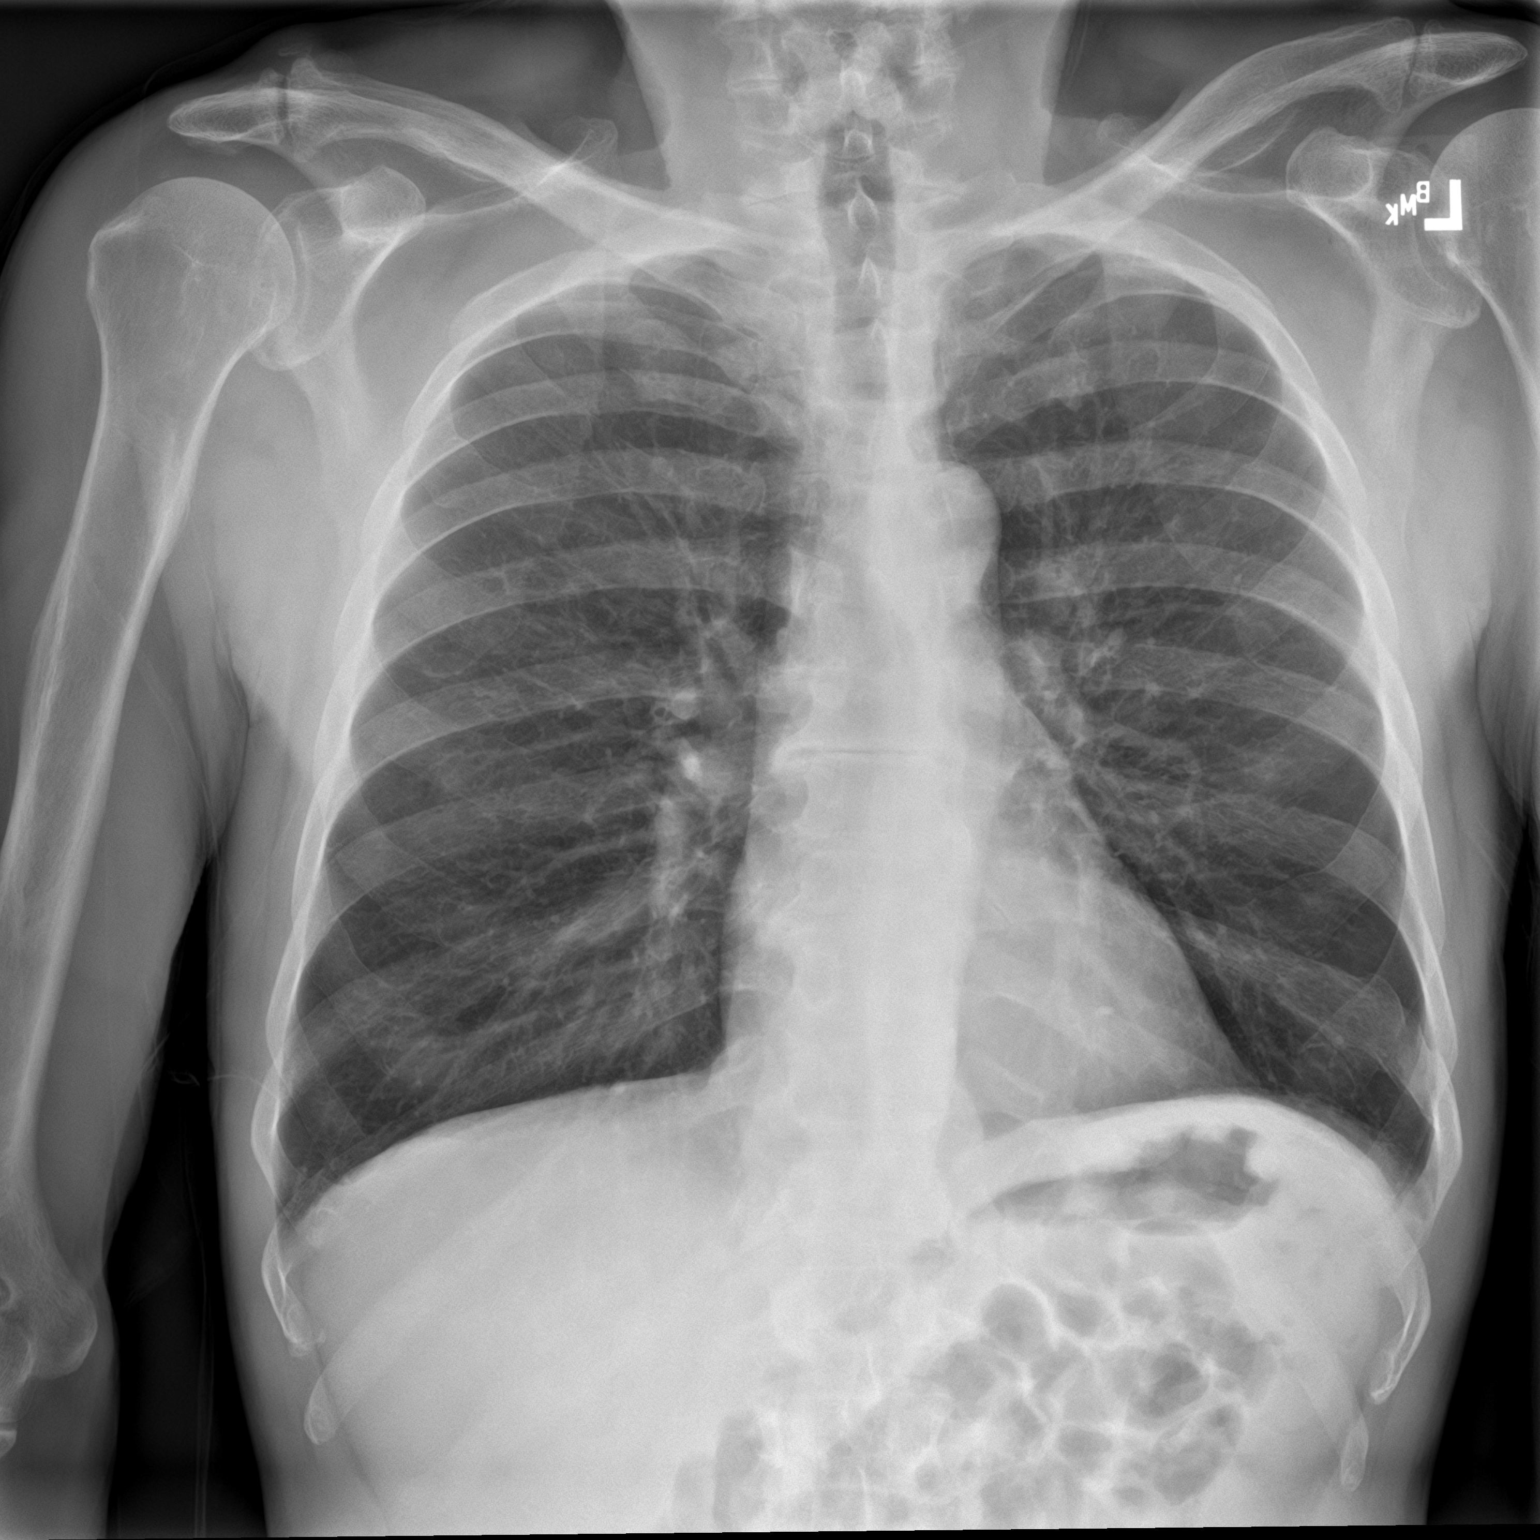

[chest lat]
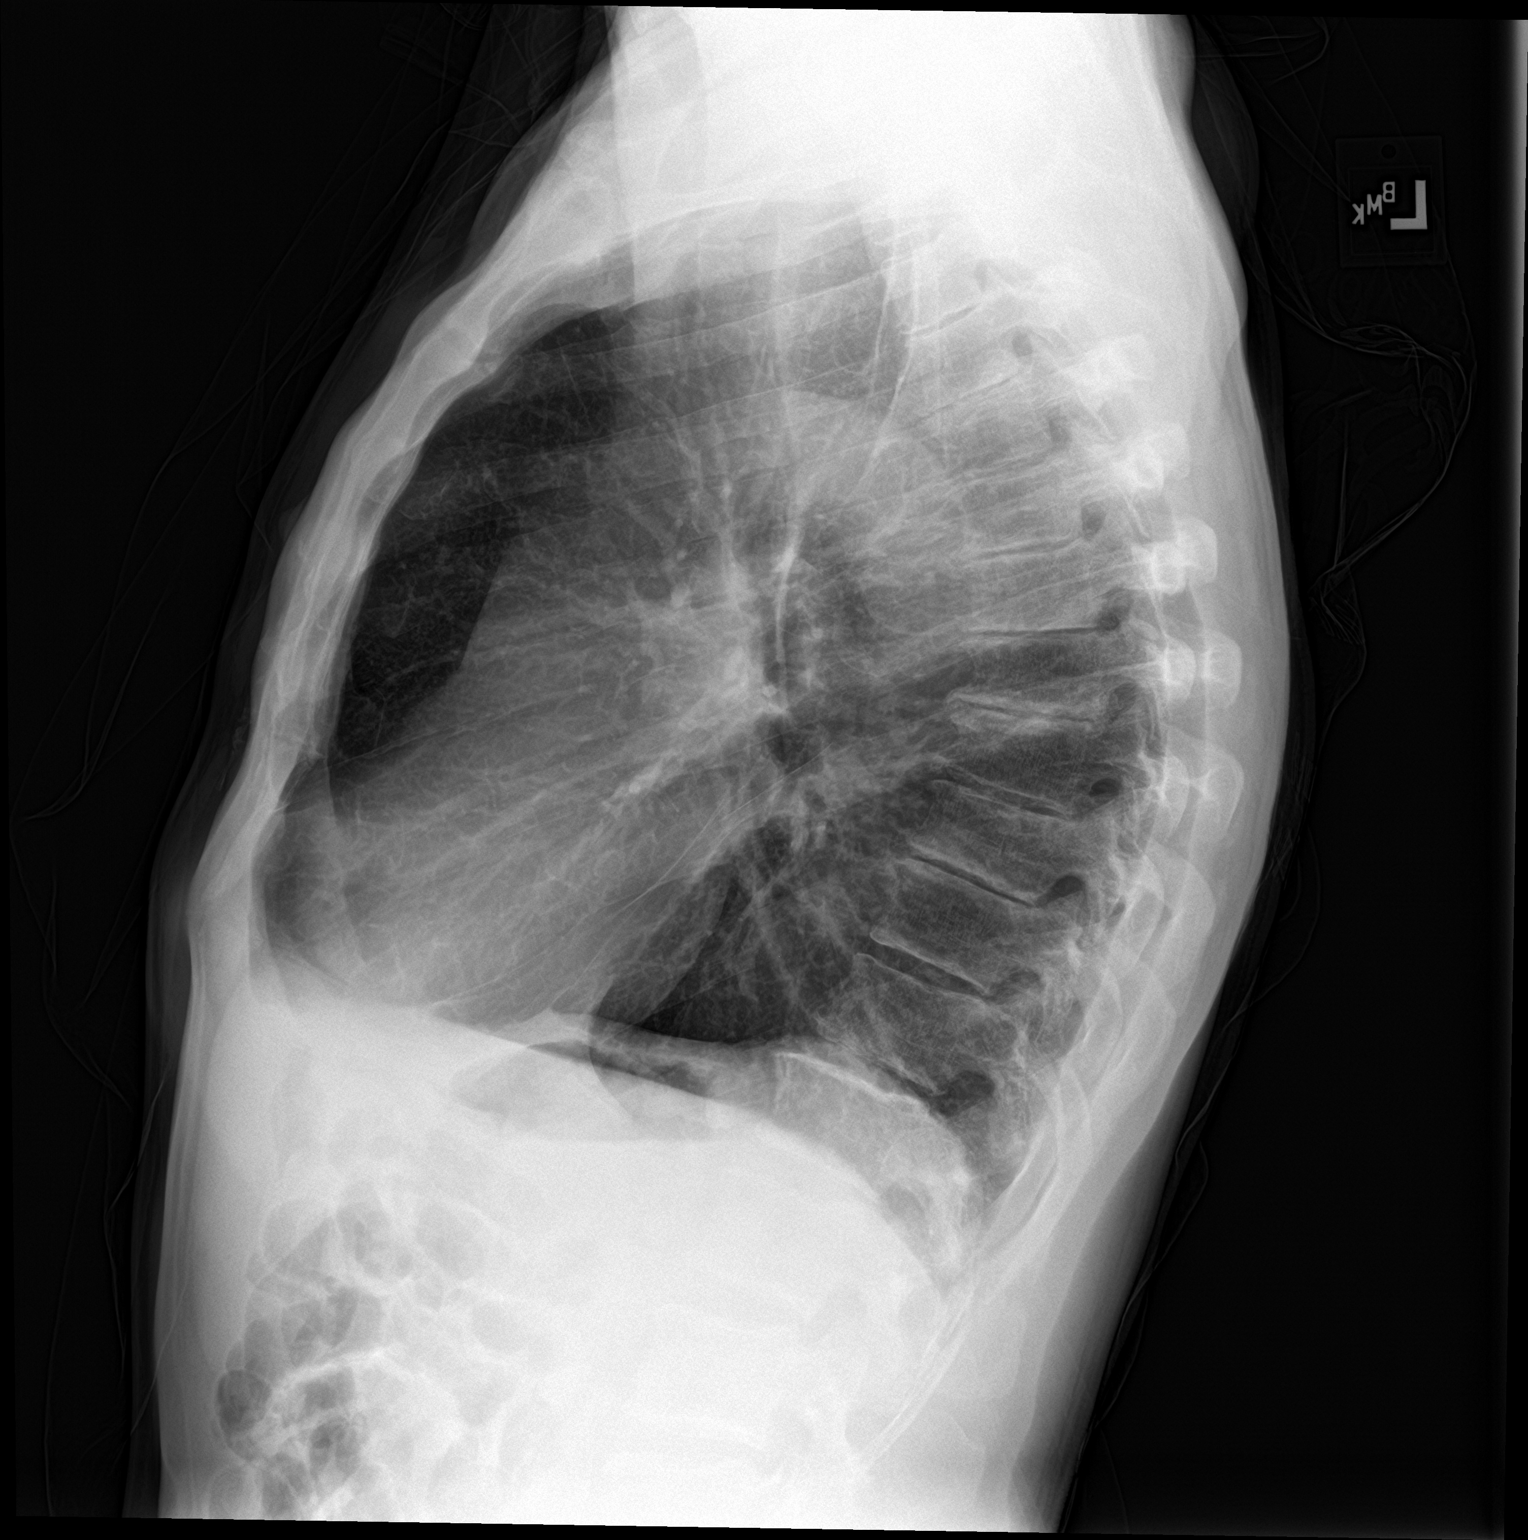

[2 of 2 positions shown; findings below may reference images not displayed]

FINDINGS: The heart size and mediastinal contours are within normal limits. No
focal airspace consolidation, pleural effusion, or pneumothorax.
Mild degenerative changes within the thoracic spine and bilateral
acromioclavicular joints.
IMPRESSION: No active cardiopulmonary disease.
